# Patient Record
Sex: Male | Born: 1935 | Race: White | Hispanic: No | Marital: Married | Smoking: Never smoker
Health system: Southern US, Community
[De-identification: ages and names within clinical notes are randomized; demographics above are authoritative.]

## PROBLEM LIST (undated history)

## (undated) DIAGNOSIS — E119 Type 2 diabetes mellitus without complications: Secondary | ICD-10-CM

## (undated) DIAGNOSIS — N400 Enlarged prostate without lower urinary tract symptoms: Secondary | ICD-10-CM

## (undated) DIAGNOSIS — R350 Frequency of micturition: Secondary | ICD-10-CM

## (undated) DIAGNOSIS — G20A1 Parkinson's disease without dyskinesia, without mention of fluctuations: Secondary | ICD-10-CM

## (undated) DIAGNOSIS — G20 Parkinson's disease: Secondary | ICD-10-CM

---
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
## 1996-11-25 DIAGNOSIS — I10 Essential (primary) hypertension: Secondary | ICD-10-CM | POA: Insufficient documentation

## 2011-02-26 DIAGNOSIS — R269 Unspecified abnormalities of gait and mobility: Secondary | ICD-10-CM | POA: Insufficient documentation

## 2011-04-08 DIAGNOSIS — E1142 Type 2 diabetes mellitus with diabetic polyneuropathy: Secondary | ICD-10-CM | POA: Insufficient documentation

## 2012-11-12 DIAGNOSIS — R350 Frequency of micturition: Secondary | ICD-10-CM | POA: Insufficient documentation

## 2012-11-12 DIAGNOSIS — E160 Drug-induced hypoglycemia without coma: Secondary | ICD-10-CM | POA: Insufficient documentation

## 2012-11-12 DIAGNOSIS — D649 Anemia, unspecified: Secondary | ICD-10-CM | POA: Insufficient documentation

## 2013-02-03 DIAGNOSIS — R259 Unspecified abnormal involuntary movements: Secondary | ICD-10-CM | POA: Insufficient documentation

## 2013-02-06 DIAGNOSIS — R2689 Other abnormalities of gait and mobility: Secondary | ICD-10-CM | POA: Insufficient documentation

## 2014-02-07 ENCOUNTER — Encounter: Payer: Self-pay | Admitting: Podiatry

## 2014-02-07 ENCOUNTER — Ambulatory Visit (INDEPENDENT_AMBULATORY_CARE_PROVIDER_SITE_OTHER): Payer: Medicare Other

## 2014-02-07 ENCOUNTER — Ambulatory Visit (INDEPENDENT_AMBULATORY_CARE_PROVIDER_SITE_OTHER): Payer: Medicare Other | Admitting: Podiatry

## 2014-02-07 VITALS — BP 136/62 | HR 58 | Resp 16 | Ht 69.0 in | Wt 144.0 lb

## 2014-02-07 DIAGNOSIS — M79609 Pain in unspecified limb: Secondary | ICD-10-CM

## 2014-02-07 DIAGNOSIS — B351 Tinea unguium: Secondary | ICD-10-CM

## 2014-02-07 DIAGNOSIS — L608 Other nail disorders: Secondary | ICD-10-CM

## 2014-02-07 DIAGNOSIS — E109 Type 1 diabetes mellitus without complications: Secondary | ICD-10-CM

## 2014-02-07 NOTE — Progress Notes (Signed)
   Subjective:    Patient ID: Chase Flores, male    DOB: Apr 22, 1924, 80 y.o.   MRN: [id number]  HPI Comments: i have a fungus problem. The nails do not hurt. Ive had it for several years. i was treated for it at [facility] years ago. i have my nails trimmed by my doctor at [facility].      Review of Systems  HENT: Positive for hearing loss.   Endocrine:       Increase urination  Genitourinary: Positive for urgency and frequency.  Musculoskeletal:       Difficulty walking  Skin:       Change in nails  All other systems reviewed and are negative.      Objective:   Physical Exam: I have reviewed his past medical history medications allergies surgeries social history and review of systems. Pulses are strongly palpable bilateral. Neurologic sensorium appears to be intact deep tendon reflexes are intact bilateral muscle strength is 5 over 5 dorsiflexors plantar flexors inverters everters all of his musculature is intact. Orthopedic evaluation demonstrates all joints distal to the ankle have a full range of motion without crepitation. Cutaneous evaluation demonstrates supple well hydrated cutis thick yellow dystrophic clinically mycotic nail hallux left. Second digit of the right foot as well. However the others remain thick and non-discolored. History of diabetes is noted here.        Assessment & Plan:  Assessment: Non-insulin-dependent diabetes mellitus. Pain in limb secondary to onychomycosis 1 through 5 bilateral.  Plan: Debridement of nails bilateral covered

## 2014-03-01 DIAGNOSIS — I10 Essential (primary) hypertension: Secondary | ICD-10-CM | POA: Insufficient documentation

## 2014-03-01 DIAGNOSIS — N400 Enlarged prostate without lower urinary tract symptoms: Secondary | ICD-10-CM | POA: Insufficient documentation

## 2014-04-14 ENCOUNTER — Observation Stay: Payer: Self-pay | Admitting: Internal Medicine

## 2014-04-14 LAB — COMPREHENSIVE METABOLIC PANEL
Albumin: 2.9 g/dL — ABNORMAL LOW (ref 3.4–5.0)
Alkaline Phosphatase: 91 U/L
Anion Gap: 6 — ABNORMAL LOW (ref 7–16)
BILIRUBIN TOTAL: 0.6 mg/dL (ref 0.2–1.0)
BUN: 30 mg/dL — ABNORMAL HIGH (ref 7–18)
CREATININE: 0.89 mg/dL (ref 0.60–1.30)
Calcium, Total: 8 mg/dL — ABNORMAL LOW (ref 8.5–10.1)
Chloride: 105 mmol/L (ref 98–107)
Co2: 28 mmol/L (ref 21–32)
EGFR (African American): >60
EGFR (Non-African Amer.): >60
Glucose: 137 mg/dL — ABNORMAL HIGH (ref 65–99)
OSMOLALITY: 286 (ref 275–301)
Potassium: 4.3 mmol/L (ref 3.5–5.1)
SGOT(AST): 27 U/L (ref 15–37)
SGPT (ALT): 29 U/L
Sodium: 139 mmol/L (ref 136–145)
Total Protein: 6.5 g/dL (ref 6.4–8.2)

## 2014-04-14 LAB — URINALYSIS, COMPLETE
BILIRUBIN, UR: NEGATIVE
Bacteria: NONE SEEN
Blood: NEGATIVE
GLUCOSE, UR: NEGATIVE mg/dL (ref 0–75)
KETONE: NEGATIVE
Leukocyte Esterase: NEGATIVE
Nitrite: NEGATIVE
PROTEIN: NEGATIVE
Ph: 7 (ref 4.5–8.0)
RBC, UR: NONE SEEN /HPF (ref 0–5)
SPECIFIC GRAVITY: 1.009 (ref 1.003–1.030)
WBC UR: NONE SEEN /HPF (ref 0–5)

## 2014-04-14 LAB — CBC WITH DIFFERENTIAL/PLATELET
BASOS ABS: 0.1 10*3/uL (ref 0.0–0.1)
Basophil %: 1 %
EOS ABS: 0.2 10*3/uL (ref 0.0–0.7)
Eosinophil %: 2.3 %
HCT: 37.2 % — ABNORMAL LOW (ref 40.0–52.0)
HGB: 12.1 g/dL — AB (ref 13.0–18.0)
LYMPHS PCT: 17.7 %
Lymphocyte #: 1.4 10*3/uL (ref 1.0–3.6)
MCH: 31.9 pg (ref 26.0–34.0)
MCHC: 32.7 g/dL (ref 32.0–36.0)
MCV: 98 fL (ref 80–100)
MONO ABS: 0.8 x10 3/mm (ref 0.2–1.0)
Monocyte %: 9.7 %
Neutrophil #: 5.7 10*3/uL (ref 1.4–6.5)
Neutrophil %: 69.3 %
Platelet: 231 10*3/uL (ref 150–440)
RBC: 3.8 10*6/uL — ABNORMAL LOW (ref 4.40–5.90)
RDW: 13.4 % (ref 11.5–14.5)
WBC: 8.2 10*3/uL (ref 3.8–10.6)

## 2014-04-14 LAB — TROPONIN I: Troponin-I: 0.02 ng/mL

## 2014-04-14 LAB — PROTIME-INR
INR: 0.9
PROTHROMBIN TIME: 12.2 s (ref 11.5–14.7)

## 2014-04-14 LAB — APTT: Activated PTT: 27.9 secs (ref 23.6–35.9)

## 2014-04-15 LAB — CBC WITH DIFFERENTIAL/PLATELET
BASOS ABS: 0.1 10*3/uL (ref 0.0–0.1)
Basophil %: 1.1 %
Eosinophil #: 0.2 10*3/uL (ref 0.0–0.7)
Eosinophil %: 2.6 %
HCT: 36.5 % — AB (ref 40.0–52.0)
HGB: 12.1 g/dL — ABNORMAL LOW (ref 13.0–18.0)
Lymphocyte #: 1.5 10*3/uL (ref 1.0–3.6)
Lymphocyte %: 18.6 %
MCH: 32.2 pg (ref 26.0–34.0)
MCHC: 33 g/dL (ref 32.0–36.0)
MCV: 98 fL (ref 80–100)
MONO ABS: 0.7 x10 3/mm (ref 0.2–1.0)
Monocyte %: 8.1 %
NEUTROS ABS: 5.6 10*3/uL (ref 1.4–6.5)
NEUTROS PCT: 69.6 %
PLATELETS: 220 10*3/uL (ref 150–440)
RBC: 3.75 10*6/uL — ABNORMAL LOW (ref 4.40–5.90)
RDW: 13.4 % (ref 11.5–14.5)
WBC: 8.1 10*3/uL (ref 3.8–10.6)

## 2014-04-15 LAB — BASIC METABOLIC PANEL
ANION GAP: 7 (ref 7–16)
BUN: 24 mg/dL — AB (ref 7–18)
Calcium, Total: 8 mg/dL — ABNORMAL LOW (ref 8.5–10.1)
Chloride: 108 mmol/L — ABNORMAL HIGH (ref 98–107)
Co2: 28 mmol/L (ref 21–32)
Creatinine: 0.81 mg/dL (ref 0.60–1.30)
EGFR (African American): >60
EGFR (Non-African Amer.): >60
Glucose: 86 mg/dL (ref 65–99)
OSMOLALITY: 288 (ref 275–301)
Potassium: 3.8 mmol/L (ref 3.5–5.1)
Sodium: 143 mmol/L (ref 136–145)

## 2014-04-15 LAB — LIPID PANEL
CHOLESTEROL: 108 mg/dL (ref 0–200)
HDL Cholesterol: 49 mg/dL (ref 40–60)
Ldl Cholesterol, Calc: 42 mg/dL (ref 0–100)
TRIGLYCERIDES: 87 mg/dL (ref 0–200)
VLDL CHOLESTEROL, CALC: 17 mg/dL (ref 5–40)

## 2014-04-15 LAB — HEMOGLOBIN A1C: HEMOGLOBIN A1C: 8.1 % — AB (ref 4.2–6.3)

## 2014-04-25 DIAGNOSIS — N3281 Overactive bladder: Secondary | ICD-10-CM | POA: Insufficient documentation

## 2014-05-11 ENCOUNTER — Ambulatory Visit: Payer: Medicare Other | Admitting: Podiatry

## 2014-05-16 DIAGNOSIS — E7800 Pure hypercholesterolemia, unspecified: Secondary | ICD-10-CM | POA: Insufficient documentation

## 2014-05-30 ENCOUNTER — Ambulatory Visit: Payer: Medicare Other | Admitting: Podiatry

## 2014-09-17 NOTE — Discharge Summary (Signed)
Dates of Admission and Diagnosis:  Date of Admission 14-Apr-2014   Date of Discharge 15-Apr-2014   Admitting Diagnosis transient ischemic attack   Final Diagnosis Transient aphasia- may be transient ischemic attack. Hypertension Diabetes Benign prostatic hyperplasia.    Chief Complaint/History of Present Illness 81 year old man presents today with difficulty speaking earlier this morning which is now resolved. He reports that he fell on Monday and hit his back and bottom and has had pain in these areas since that time. This afternoon when he awoke from a nap he realized that he could not stand when getting up from the bed, he thought this was due to the pain of the fall.  He crawled to his shoes to put them on. His wife came to ask what he was doing and he tried to speak to her, but his speech was very garbled. This lasted for 1-2 minutes. His wife called the nurse at [facility] living facility who came to assess the patient, by that time his speech was clear and his examination was normal. The nurse left and about 30 minutes later he had another episode of garbled speech which lasted again 1-2 minutes. Afterwards he was slightly confused, but speaking clearly and his wife called EMS.   Allergies:  No Known Allergies:   Pertinent Past History:  Pertinent Past History 1.  Diabetes mellitus type 1, diagnosed in his late 30s.  2.  Benign prosthetic hypertrophy.  3.  Parkinsonian symptoms without diagnosis of Parkinson disease.   Hospital Course:  Hospital Course 1. Transient expressive dysphasia:  CT shows no acute change, negative MRI, <70% block on Carotid doppler are non contributory to this.   may be this was secondary to TIA episode.   Cont ASA and Statin.   Eval by PT- suggest to have home health- pt and his wife both feels they will be fine without that, they are very independent .    2.  Diabetes mellitus type 1: Continue with Levemir and sliding scale. Carbohydrate modified  diet.  3.  Benign prostatic hypertrophy: We will continue with home medication tamsulosin, finasteride, Myrbetriq.  encouraged to use his bed pan at night time. 4.  Compression fracture: T12 compression fracture of uncertain chronicity. This does not correlate with his current pain in the lumbar area. This is likely an old fracture and does not require any further evaluation or treatment at this time.  5.  Prophylaxis: Heparin for DVT prophylaxis.  6. Htn- lisinopril and Amlodipin. Slightly high- may be due to being in hospital.   Condition on Discharge Stable   DISCHARGE INSTRUCTIONS HOME MEDS:  Medication Reconciliation: Patient's Home Medications at Discharge:     Medication Instructions  acarbose 50 mg oral tablet  0.5 tab(s) orally once a day (at bedtime)   amlodipine 2.5 mg oral tablet  1 tab(s) orally once a day   atorvastatin 80 mg oral tablet  0.5 tab(s) orally once a day   finasteride 5 mg oral tablet  1 tab(s) orally once a day at noon   lisinopril 40 mg oral tablet  1 tab(s) orally once a day   myrbetriq 50 mg oral tablet, extended release  1 tab(s) orally once a day   tamsulosin 0.4 mg oral capsule  2 cap(s) orally once a day (at bedtime)   humalog 100 units/ml subcutaneous solution  1 dose(s) subcutaneous 3 times a day per sliding scale   lantus 100 units/ml subcutaneous solution  10 unit(s) subcutaneous 2 times a day  aspirin 81 mg oral tablet  1 tab(s) orally once a day after lunch   folic acid 0.4 mg oral tablet  1 tab(s) orally once a day after lunch   vitamin c 500 mg oral tablet  1 tab(s) orally once a day after lunch   vitamin d3 2000 intl units oral tablet  1 tab(s) orally once a day after lunch     Physician's Instructions:  Diet Low Sodium  Low Fat, Low Cholesterol   Activity Limitations As tolerated   Return to Work Not Applicable   Time frame for Follow Up Appointment 1-2 weeks  PMD   Other Comments routine follow ups with PMD>     Arnoldo HookerKowalski, Bruce  J(Consultant): [facility], [address], [place], [place] [address], [place] [phone]  TIME SPENT:  Total Time: Greater than 30 minutes   Electronic Signatures: Altamese DillingVachhani, Cristino Degroff (MD)  (Signed [id number]-Nov-15 11:04)  Authored: ADMISSION DATE AND DIAGNOSIS, CHIEF COMPLAINT/HPI, Allergies, PERTINENT PAST HISTORY, HOSPITAL COURSE, DISCHARGE INSTRUCTIONS HOME MEDS, PATIENT INSTRUCTIONS, Follow Up Physician, TIME SPENT   Last Updated: 24-Nov-15 11:04 by Altamese DillingVachhani, Lorraina Spring (MD)

## 2014-09-17 NOTE — H&P (Signed)
PATIENT NAME:  Chase Flores, Chase Flores MR#:  [id number] DATE OF BIRTH:  Aug 10, 1923  DATE OF ADMISSION:  04/14/2014  REFERRING EMERGENCY ROOM PHYSICIAN:  Dr. Shaune Pollack.   PRIMARY CARE PHYSICIAN:  Dr. Larwance Sachs with [facility].    PRIMARY ENDOCRINOLOGIST, Dr. Natasha Mead at [facility].    PRIMARY UROLOGIST:  Dr. Clotilde Dieter at [facility].   CHIEF COMPLAINT: Expressive aphasia.   HISTORY OF PRESENT ILLNESS: This very pleasant 81 year old man presents today with difficulty speaking earlier this morning which is now resolved. He reports that he fell on Monday and hit his back and bottom and has had pain in these areas since that time. This afternoon when he awoke from a nap he realized that he could not stand when getting up from the bed, he thought this was due to the pain of the fall.  He crawled to his shoes to put them on. His wife came to ask what he was doing and he tried to speak to her, but his speech was very garbled. This lasted for 1-2 minutes. His wife called the nurse at [facility] living facility who came to assess the patient, by that time his speech was clear and his examination was normal. The nurse left and about 30 minutes later he had another episode of garbled speech which lasted again 1-2 minutes. Afterwards he was slightly confused, but speaking clearly and his wife called EMS.   PAST MEDICAL HISTORY:  1.  Diabetes mellitus type 1, diagnosed in his late 30s.  2.  Benign prosthetic hypertrophy.  3.  Parkinsonian symptoms without diagnosis of Parkinson disease.   PAST SURGICAL HISTORY:  Cataract surgery.   SOCIAL HISTORY: The patient lives independently in the [facility] living facility with his wife. He uses a cane occasionally for ambulation. Since Monday when he fell he has been using a borrowed walker. He does not smoke cigarettes or drink alcohol.   FAMILY MEDICAL HISTORY: Positive for coronary artery disease in his father. He has 2 brothers who also have type 1 diabetes diagnosed in their 30s. No family history  of stroke or cancers that he knows of.   ALLERGIES: No known allergies.   MEDICATIONS:   1. Vitamin D3, 2000 international units 1 tablet daily.  2. Vitamin C 1 tablet once a day 500 mg.  3. Tamsulosin 0.4 mg 2 capsules once a day at bedtime.  4. Myrbetriq 50 mg 1 tablet once a day.  5. Lisinopril 40 mg 1 tablet once a day.  6. Lantus 100 units/mL subcutaneous solution 10 units subcutaneously twice a day.  7. Humalog 100 units/mL 1 dose subcutaneously 3 times a day per sliding scale.  8. Folic acid 0.4 mg oral tablet 1 tablet once a day after lunch.  9. Finasteride 5 mg 1 tablet once a day at noon.  10. Atorvastatin 80 mg 0.5 tablets once a day.  11. Aspirin 81 mg 1 tablet once a day.  12. Amlodipine 2.5 mg 1 tablet once a day.  13. Acarbose 50 mg 0.5 tablets once a day.   REVIEW OF SYSTEMS:  CONSTITUTIONAL: Negative for fever, fatigue, pain, weight loss or weight gain.  HEENT: Negative for change in hearing or vision, no pain in the eyes or ears, no difficulty swallowing or oral pain.  RESPIRATORY: No shortness of breath, cough, wheezing, pleuritic type chest pain.  CARDIOVASCULAR: No palpitations, chest pain, edema, orthopnea.  GASTROINTESTINAL:  No nausea, vomiting, diarrhea, abdominal pain, hematochezia, or melena.  GENITOURINARY: Positive for urinary frequency,  negative for dysuria.  MUSCULOSKELETAL: Positive for pain in the low back after a fall, negative for swollen tender joints, positive for chronic Parkinsonian-type symptoms with a shuffling gait and some rigidity.  NEUROLOGIC: No focal numbness or weakness. Positive for expressive aphasia earlier this morning, no headache, no seizure, has had some confusion today, otherwise at baseline is usually very clear.  PSYCHIATRY: No uncontrolled depression or anxiety.   PHYSICAL EXAMINATION:  VITAL SIGNS: Temperature 98.5, pulse 71, respirations 16, blood pressure 198/88, oxygenation 98% on room air.  GENERAL: No acute distress,  thin.  HEENT: Pupils equal, round, and reactive to light, conjunctivae are clear, extraocular motion is intact, oral mucous membranes are pink and moist, good dentition, posterior oropharynx is clear, no cervical lymphadenopathy, trachea midline, thyroid is nontender.  PULMONARY: Lungs are clear to auscultation bilaterally with good air movement.  CARDIOVASCULAR: Regular rate and rhythm, there is a 5/6 diastolic murmur heard best at the far left chest but heard easily throughout the precordium, no carotid bruit, no peripheral edema, peripheral pulses are 1 +.  ABDOMEN: Soft, nontender, nondistended, no guarding, no rebound, bowel sounds are normal.  MUSCULOSKELETAL: No joint effusions, range of motion is normal, strength is 5 out of 5 throughout.  NEUROLOGIC: Cranial nerves II through XII grossly intact, strength and sensation intact, nonfocal neurologic examination, the patient is speaking clearly at the time of interview.  PSYCHIATRIC: The patient is alert and oriented, he does not show any signs of uncontrolled depression or anxiety.   LABORATORY DATA: Serum sodium 139, potassium 4.3, chloride 105, bicarbonate 28, BUN 30, creatinine 0.89, glucose 137. LFTs show low albumin at 2.9. Troponin is 0.02. White blood cells 8.2, hemoglobin 12.1, MCV 98, platelets 231,000. UA is negative for signs of infection.   IMAGING:  1.  CT of the head shows no evidence of acute intracranial abnormality. Moderate cerebral atrophy and mild chronic small vessel ischemic disease with possible chronic lacunar infarcts.  2.  X-ray of the lumbar spine shows T12 compression deformity of uncertain chronicity.  3.  Abdomen 3 way shows nonspecific chest and abdomen. T12 compression deformity.   ASSESSMENT AND PLAN:  1.  Transient expressive dysphasia: CT shows no acute change, though it does note some old lacunar infarcts. Will obtain an MRI. Will continue with aspirin and statin therapy. We will admit the patient and monitor  on telemetry. We will get a physical therapy consultation as well as a speech therapy consultation. He has a loud murmur, wife reports that he has never been told about this murmur in the past. We will get a 2-D echocardiogram for further evaluation. We will also order carotid Dopplers as there is evidence of old stroke.  2.  Diabetes mellitus type 1: Continue with Levemir and sliding scale. We will check a hemoglobin A1c.  Wife reports that blood sugars have been elevated recently. Carbohydrate modified diet.  3.  Benign prostatic hypertrophy: We will continue with home medication tamsulosin, finasteride, Myrbetriq.   4.  Compression fracture: T12 compression fracture of uncertain chronicity. This does not correlate with his current pain in the lumbar area. This is likely an old fracture and does not require any further evaluation or treatment at this time.  5.  Prophylaxis: Heparin for DVT prophylaxis. We will not initiate PPI as he is not critically ill at this time.  6.  Hypertension: The patient currently has a very elevated blood pressure report. We will continue with home blood pressure medications amlodipine and lisinopril with  hydralazine p.r.n. for systolic blood pressures over 161. We will allow for permissive hypertension in the setting of possible acute stroke.   TIME SPENT ON ADMISSION: 45 minutes.    ____________________________ Ena Dawley. Clent Ridges, MD cpw:bu D: 04/14/2014 19:13:31 ET T: 04/14/2014 19:48:16 ET JOB#: [id number]  cc: Santina Evans P. Clent Ridges, MD, <Dictator> Gale Journey MD ELECTRONICALLY SIGNED 04/14/2014 22:47

## 2014-12-13 DIAGNOSIS — E109 Type 1 diabetes mellitus without complications: Secondary | ICD-10-CM | POA: Insufficient documentation

## 2015-06-07 ENCOUNTER — Ambulatory Visit (INDEPENDENT_AMBULATORY_CARE_PROVIDER_SITE_OTHER): Payer: Medicare Other | Admitting: Podiatry

## 2015-06-07 ENCOUNTER — Encounter: Payer: Self-pay | Admitting: Podiatry

## 2015-06-07 DIAGNOSIS — B351 Tinea unguium: Secondary | ICD-10-CM | POA: Diagnosis not present

## 2015-06-07 DIAGNOSIS — S90129A Contusion of unspecified lesser toe(s) without damage to nail, initial encounter: Secondary | ICD-10-CM

## 2015-06-07 DIAGNOSIS — M79676 Pain in unspecified toe(s): Secondary | ICD-10-CM | POA: Diagnosis not present

## 2015-06-07 NOTE — Progress Notes (Signed)
He presents today chief complaint of painful elongated toenails bilaterally. Concerned about a dark area on the second digit left foot.  Objective: Pulses are palpable bilateral. His nails are thick yellow dystrophic with mycotic subungual hematoma second nail plate left.  Assessment: Pain in limb cigarette onychomycosis and subungual hematoma second toe left.  Plan: Debridement of nails 1 through 5 bilateral cover service secondary to pain.

## 2015-06-21 ENCOUNTER — Ambulatory Visit: Payer: Medicare Other | Admitting: Podiatry

## 2015-08-30 ENCOUNTER — Encounter: Payer: Self-pay | Admitting: Podiatry

## 2015-08-30 ENCOUNTER — Ambulatory Visit (INDEPENDENT_AMBULATORY_CARE_PROVIDER_SITE_OTHER): Payer: Medicare Other | Admitting: Podiatry

## 2015-08-30 DIAGNOSIS — M79676 Pain in unspecified toe(s): Secondary | ICD-10-CM

## 2015-08-30 DIAGNOSIS — B351 Tinea unguium: Secondary | ICD-10-CM | POA: Diagnosis not present

## 2015-08-30 NOTE — Progress Notes (Signed)
He presents today with a chief complaint of diabetes and painful elongated toenails.  Objective: Vital signs stable alert and oriented 3. Pulses are palpable. Capillary fill time is perfect bilateral. Toenails are thick yellow dystrophic onychomycotic painful. Subungual hematomas noted second digit left foot.  Assessment: Pain in limb secondary to onychomycosis.  Plan: Debridement of toenails 1 through 5 bilateral.

## 2015-11-17 ENCOUNTER — Emergency Department: Payer: Medicare Other

## 2015-11-17 ENCOUNTER — Other Ambulatory Visit: Payer: Self-pay

## 2015-11-17 ENCOUNTER — Inpatient Hospital Stay
Admission: EM | Admit: 2015-11-17 | Discharge: 2015-11-19 | DRG: 638 | Disposition: A | Discharge status: Home / Self Care | Payer: Medicare Other | Attending: Internal Medicine | Admitting: Internal Medicine

## 2015-11-17 DIAGNOSIS — N400 Enlarged prostate without lower urinary tract symptoms: Secondary | ICD-10-CM | POA: Diagnosis present

## 2015-11-17 DIAGNOSIS — R29818 Other symptoms and signs involving the nervous system: Secondary | ICD-10-CM | POA: Diagnosis present

## 2015-11-17 DIAGNOSIS — Z794 Long term (current) use of insulin: Secondary | ICD-10-CM

## 2015-11-17 DIAGNOSIS — Z79899 Other long term (current) drug therapy: Secondary | ICD-10-CM

## 2015-11-17 DIAGNOSIS — E7800 Pure hypercholesterolemia, unspecified: Secondary | ICD-10-CM | POA: Diagnosis present

## 2015-11-17 DIAGNOSIS — E10649 Type 1 diabetes mellitus with hypoglycemia without coma: Principal | ICD-10-CM | POA: Diagnosis present

## 2015-11-17 DIAGNOSIS — G20 Parkinson's disease: Secondary | ICD-10-CM | POA: Diagnosis present

## 2015-11-17 DIAGNOSIS — Z7951 Long term (current) use of inhaled steroids: Secondary | ICD-10-CM

## 2015-11-17 DIAGNOSIS — I639 Cerebral infarction, unspecified: Secondary | ICD-10-CM | POA: Diagnosis present

## 2015-11-17 DIAGNOSIS — Z820 Family history of epilepsy and other diseases of the nervous system: Secondary | ICD-10-CM

## 2015-11-17 DIAGNOSIS — Z7982 Long term (current) use of aspirin: Secondary | ICD-10-CM

## 2015-11-17 DIAGNOSIS — R001 Bradycardia, unspecified: Secondary | ICD-10-CM | POA: Diagnosis present

## 2015-11-17 DIAGNOSIS — Z833 Family history of diabetes mellitus: Secondary | ICD-10-CM

## 2015-11-17 DIAGNOSIS — R259 Unspecified abnormal involuntary movements: Secondary | ICD-10-CM | POA: Diagnosis present

## 2015-11-17 DIAGNOSIS — I6529 Occlusion and stenosis of unspecified carotid artery: Secondary | ICD-10-CM | POA: Diagnosis present

## 2015-11-17 DIAGNOSIS — E109 Type 1 diabetes mellitus without complications: Secondary | ICD-10-CM | POA: Diagnosis present

## 2015-11-17 DIAGNOSIS — R4781 Slurred speech: Secondary | ICD-10-CM | POA: Diagnosis present

## 2015-11-17 DIAGNOSIS — R4701 Aphasia: Secondary | ICD-10-CM | POA: Diagnosis not present

## 2015-11-17 DIAGNOSIS — I472 Ventricular tachycardia: Secondary | ICD-10-CM | POA: Diagnosis present

## 2015-11-17 DIAGNOSIS — I10 Essential (primary) hypertension: Secondary | ICD-10-CM | POA: Diagnosis present

## 2015-11-17 HISTORY — DX: Benign prostatic hyperplasia without lower urinary tract symptoms: N40.0

## 2015-11-17 HISTORY — DX: Type 2 diabetes mellitus without complications: E11.9

## 2015-11-17 HISTORY — DX: Parkinson's disease without dyskinesia, without mention of fluctuations: G20.A1

## 2015-11-17 HISTORY — DX: Parkinson's disease: G20

## 2015-11-17 HISTORY — DX: Frequency of micturition: R35.0

## 2015-11-17 LAB — CBC
HCT: 38.1 % — ABNORMAL LOW (ref 40.0–52.0)
Hemoglobin: 12.9 g/dL — ABNORMAL LOW (ref 13.0–18.0)
MCH: 33.3 pg (ref 26.0–34.0)
MCHC: 33.7 g/dL (ref 32.0–36.0)
MCV: 98.8 fL (ref 80.0–100.0)
PLATELETS: 179 10*3/uL (ref 150–440)
RBC: 3.86 MIL/uL — AB (ref 4.40–5.90)
RDW: 13.4 % (ref 11.5–14.5)
WBC: 7.1 10*3/uL (ref 3.8–10.6)

## 2015-11-17 LAB — COMPREHENSIVE METABOLIC PANEL
ALBUMIN: 3.8 g/dL (ref 3.5–5.0)
ALT: 19 U/L (ref 17–63)
ANION GAP: 8 (ref 5–15)
AST: 25 U/L (ref 15–41)
Alkaline Phosphatase: 79 U/L (ref 38–126)
BUN: 39 mg/dL — ABNORMAL HIGH (ref 6–20)
CALCIUM: 8.8 mg/dL — AB (ref 8.9–10.3)
CO2: 28 mmol/L (ref 22–32)
Chloride: 102 mmol/L (ref 101–111)
Creatinine, Ser: 0.94 mg/dL (ref 0.61–1.24)
GFR calc non Af Amer: >60 mL/min (ref >60)
GLUCOSE: 273 mg/dL — AB (ref 65–99)
POTASSIUM: 4.8 mmol/L (ref 3.5–5.1)
SODIUM: 138 mmol/L (ref 135–145)
Total Bilirubin: 1 mg/dL (ref 0.3–1.2)
Total Protein: 6.9 g/dL (ref 6.5–8.1)

## 2015-11-17 LAB — GLUCOSE, CAPILLARY
GLUCOSE-CAPILLARY: 256 mg/dL — AB (ref 65–99)
Glucose-Capillary: 262 mg/dL — ABNORMAL HIGH (ref 65–99)

## 2015-11-17 LAB — URINALYSIS COMPLETE WITH MICROSCOPIC (ARMC ONLY)
Bacteria, UA: NONE SEEN
Bilirubin Urine: NEGATIVE
HGB URINE DIPSTICK: NEGATIVE
KETONES UR: NEGATIVE mg/dL
Leukocytes, UA: NEGATIVE
NITRITE: NEGATIVE
PROTEIN: NEGATIVE mg/dL
SPECIFIC GRAVITY, URINE: 1.01 (ref 1.005–1.030)
Squamous Epithelial / LPF: NONE SEEN
pH: 9 — ABNORMAL HIGH (ref 5.0–8.0)

## 2015-11-17 LAB — PROTIME-INR
INR: 0.98
PROTHROMBIN TIME: 13.2 s (ref 11.4–15.0)

## 2015-11-17 LAB — TROPONIN I: Troponin I: <0.03 ng/mL (ref <0.031)

## 2015-11-17 LAB — DIFFERENTIAL
BASOS PCT: 1 %
Basophils Absolute: 0.1 10*3/uL (ref 0–0.1)
EOS ABS: 0.1 10*3/uL (ref 0–0.7)
EOS PCT: 2 %
Lymphocytes Relative: 24 %
Lymphs Abs: 1.7 10*3/uL (ref 1.0–3.6)
MONO ABS: 0.6 10*3/uL (ref 0.2–1.0)
Monocytes Relative: 8 %
NEUTROS PCT: 65 %
Neutro Abs: 4.6 10*3/uL (ref 1.4–6.5)

## 2015-11-17 LAB — APTT: aPTT: 31 seconds (ref 24–36)

## 2015-11-17 MED ORDER — ONDANSETRON HCL 4 MG/2ML IJ SOLN
INTRAMUSCULAR | Status: AC
  Administered 2015-11-17: 4 mg via INTRAVENOUS
  Filled 2015-11-17: qty 2

## 2015-11-17 MED ORDER — ASPIRIN 81 MG PO CHEW
324.0000 mg | CHEWABLE_TABLET | Freq: Once | ORAL | Status: AC
  Administered 2015-11-17: 324 mg via ORAL
  Filled 2015-11-17: qty 4

## 2015-11-17 MED ORDER — HYDRALAZINE HCL 20 MG/ML IJ SOLN
10.0000 mg | Freq: Once | INTRAMUSCULAR | Status: AC
  Administered 2015-11-17: 10 mg via INTRAVENOUS
  Filled 2015-11-17: qty 1

## 2015-11-17 MED ORDER — ONDANSETRON HCL 4 MG/2ML IJ SOLN
4.0000 mg | Freq: Once | INTRAMUSCULAR | Status: AC
  Administered 2015-11-17: 4 mg via INTRAVENOUS

## 2015-11-17 MED ORDER — LABETALOL HCL 5 MG/ML IV SOLN
INTRAVENOUS | Status: AC
  Administered 2015-11-17: 10 mg via INTRAVENOUS
  Filled 2015-11-17: qty 4

## 2015-11-17 MED ORDER — IOPAMIDOL (ISOVUE-370) INJECTION 76%
75.0000 mL | Freq: Once | INTRAVENOUS | Status: AC | PRN
  Administered 2015-11-17: 75 mL via INTRAVENOUS

## 2015-11-17 MED ORDER — LABETALOL HCL 5 MG/ML IV SOLN
10.0000 mg | Freq: Once | INTRAVENOUS | Status: AC
  Administered 2015-11-17: 10 mg via INTRAVENOUS

## 2015-11-17 NOTE — ED Notes (Addendum)
Pt bib EMS w/ c/o confusion and difficulty speaking that started 30 minutes ago.  Pt worse upon arrival to ED per EMS.  Per EMS, pt able to stand and walk to truck.  Wife sts that s/s start approx 1800.  She sts that he was trying to communicate, when speech grabbled.  Pt alert, resp even and skin WNL.  Pt unable to give name/age.  Word salad

## 2015-11-17 NOTE — ED Provider Notes (Signed)
[facility] Emergency Department Provider Note   ____________________________________________  Time seen: Approximately 8:04 PM  I have reviewed the triage vital signs and the nursing notes.   HISTORY  Chief Complaint Code Stroke    HPI Chase Flores is a 82 y.o. male with history of BPH with urinary obstruction, type 1 diabetes, hyperlipidemia, hypertension who presents for evaluation of speech difficulty since 6:00 this evening, gradual onset, constant, moderate, no modifying factors. This was noted by his wife at 6 PM, prior to that he had been in his usual state of health. No numbness, weakness, no recent illness including no vomiting, diarrhea, fevers or chills, no chest pain or difficulty breathing.   Past Medical History  Diagnosis Date  . Diabetes mellitus without complication (HCC)     type 1  . Enlarged prostate   . Parkinson disease (HCC)   . Frequency of urination     Patient Active Problem List   Diagnosis Date Noted  . Type 1 diabetes mellitus (HCC) 12/13/2014  . Pure hypercholesterolemia 05/16/2014  . Detrusor muscle hypertonia 04/25/2014  . Benign prostatic hyperplasia with urinary obstruction 03/01/2014  . Benign essential HTN 03/01/2014  . Multifactorial gait disorder 02/06/2013  . Parkinsonian features 02/03/2013  . Absolute anemia 11/12/2012  . FOM (frequency of micturition) 11/12/2012  . Hypoglycemia 11/12/2012  . Diabetic polyneuropathy (HCC) 04/08/2011  . Abnormal gait 02/26/2011  . Essential (primary) hypertension 11/25/1996  . Hypercholesterolemia 11/25/1996  . Contracture of palmar fascia 05/27/1899  . Blepharoptosis 05/27/1899    History reviewed. No pertinent past surgical history.  Current Outpatient Rx  Name  Route  Sig  Dispense  Refill  . acarbose (PRECOSE) 50 MG tablet   Oral   Take 25-50 mg by mouth at bedtime.          Marland Kitchen. amLODipine (NORVASC) 2.5 MG tablet   Oral   Take 2.5 mg by mouth daily.         . Ascorbic Acid (VITAMIN C WITH ROSE HIPS) 500 MG tablet   Oral   Take 500 mg by mouth daily.         Marland Kitchen. aspirin EC 81 MG tablet   Oral   Take 81 mg by mouth daily.          Marland Kitchen. atorvastatin (LIPITOR) 80 MG tablet   Oral   Take 40-80 mg by mouth at bedtime.          . cholecalciferol (VITAMIN D) 1000 units tablet   Oral   Take 1,000 Units by mouth daily.         . finasteride (PROSCAR) 5 MG tablet   Oral   Take 5 mg by mouth daily.          . fluticasone (FLONASE) 50 MCG/ACT nasal spray   Each Nare   Place 2 sprays into both nostrils daily.         . folic acid (FOLVITE) 400 MCG tablet   Oral   Take 400 mcg by mouth daily.          . insulin glargine (LANTUS) 100 UNIT/ML injection   Subcutaneous   Inject 7 Units into the skin 2 (two) times daily.          . insulin lispro (HUMALOG) 100 UNIT/ML injection   Subcutaneous   Inject 7 Units into the skin 3 (three) times daily before meals. Pt is to increase units based on BS readings.         .Marland Kitchen  lisinopril (PRINIVIL,ZESTRIL) 10 MG tablet   Oral   Take 10 mg by mouth daily.          . mirabegron ER (MYRBETRIQ) 50 MG TB24 tablet   Oral   Take 50 mg by mouth daily.          . sodium fluoride (DENTA 5000 PLUS) 1.1 % CREA dental cream   dental   Place 1 application onto teeth at bedtime.         . tamsulosin (FLOMAX) 0.4 MG CAPS capsule   Oral   Take 0.8 mg by mouth daily after supper.            Allergies Review of patient's allergies indicates no known allergies.  No family history on file.  Social History Social History  Substance Use Topics  . Smoking status: Never Smoker   . Smokeless tobacco: None  . Alcohol Use: No    Review of Systems Constitutional: No fever/chills Eyes: No visual changes. ENT: No sore throat. Cardiovascular: Denies chest pain. Respiratory: Denies shortness of breath. Gastrointestinal: No abdominal pain.  No nausea, no vomiting.  No diarrhea.  No  constipation. Genitourinary: Negative for dysuria. Musculoskeletal: Negative for back pain. Skin: Negative for rash. Neurological: Negative for headaches, focal weakness or numbness.  10-point ROS otherwise negative.  ____________________________________________   PHYSICAL EXAM:  VITAL SIGNS: ED Triage Vitals  Enc Vitals Group     BP 11/17/15 2002 223/81 mmHg     Pulse Rate 11/17/15 2002 65     Resp 11/17/15 2002 20     Temp 11/17/15 2002 99.3 F (37.4 C)     Temp Source 11/17/15 2002 Oral     SpO2 11/17/15 2002 98 %     Weight 11/17/15 2002 145 lb (65.772 kg)     Height --      Head Cir --      Peak Flow --      Pain Score --      Pain Loc --      Pain Edu? --      Excl. in GC? --     Constitutional: Alert. Sitting up in bed. He'll follow commands. Has  fluent expressive aphasia. Well appearing and in no acute distress. Eyes: Conjunctivae are normal. PERRL. EOMI. Head: Atraumatic. Nose: No congestion/rhinnorhea. Mouth/Throat: Mucous membranes are moist.  Oropharynx non-erythematous. Neck: No stridor. Supple without meningismus. Cardiovascular: Normal rate, regular rhythm. Grossly normal heart sounds.  Good peripheral circulation. Respiratory: Normal respiratory effort.  No retractions. Lungs CTAB. Gastrointestinal: Soft and nontender. No distention. No CVA tenderness. Genitourinary: deferred Musculoskeletal: No lower extremity tenderness nor edema.  No joint effusions. Neurologic:  Fluent expressive aphasia. 5 out of 5 strength in bilateral upper and lower extremities, sensation intact to light touch throughout. Skin:  Skin is warm, dry and intact. No rash noted. Psychiatric: Mood and affect are normal. Speech and behavior are normal.  ____________________________________________   LABS (all labs ordered are listed, but only abnormal results are displayed)  Labs Reviewed  CBC - Abnormal; Notable for the following:    RBC 3.86 (*)    Hemoglobin 12.9 (*)    HCT  38.1 (*)    All other components within normal limits  COMPREHENSIVE METABOLIC PANEL - Abnormal; Notable for the following:    Glucose, Bld 273 (*)    BUN 39 (*)    Calcium 8.8 (*)    All other components within normal limits  GLUCOSE, CAPILLARY - Abnormal; Notable for the  following:    Glucose-Capillary 256 (*)    All other components within normal limits  URINALYSIS COMPLETEWITH MICROSCOPIC ([facility] ONLY) - Abnormal; Notable for the following:    Color, Urine STRAW (*)    APPearance CLEAR (*)    Glucose, UA >500 (*)    pH 9.0 (*)    All other components within normal limits  GLUCOSE, CAPILLARY - Abnormal; Notable for the following:    Glucose-Capillary 262 (*)    All other components within normal limits  PROTIME-INR  APTT  DIFFERENTIAL  TROPONIN I  CBG MONITORING, ED   ____________________________________________  EKG  ED ECG REPORT I, Gayla DossGayle, Rotha Cassels A, the attending physician, personally viewed and interpreted this ECG.   Date: 11/17/2015  EKG Time: 19:50  Rate: 87  Rhythm: normal sinus rhythm, PACs  Axis: normal  Intervals:none  ST&T Change: No acute ST elevation, mild ST depression in V5. Q-wave in V2.  ____________________________________________  RADIOLOGY  CT head IMPRESSION: 1. No CT evidence of acute cortical infarction. 2. No intracranial hemorrhage. 3. Marked generalized cortical atrophy. Findings conveyed toERYKA Ginnette Gates on 11/17/2015 at20:19.  CTA head IMPRESSION: No emergent large vessel occlusion.  Severe stenoses bilateral supraclinoid internal carotid arteries. Additional high-grade stenoses of the anterior and posterior circulation, compatible with atherosclerosis. ____________________________________________   PROCEDURES  Procedure(s) performed: None  Critical Care performed: No  ____________________________________________   INITIAL IMPRESSION / ASSESSMENT AND PLAN / ED COURSE  Pertinent labs & imaging results that were available  during my care of the patient were reviewed by me and considered in my medical decision making (see chart for details).  Chase Flores is a 82 y.o. male with history of BPH with urinary obstruction, type 1 diabetes, hyperlipidemia, hypertension who presents for evaluation of speech difficulty since 6:00 pm this evening. On exam, he has a fluent expressive aphasia. No motor deficits or sensory deficits. Code stroke initiated, plan for CT head, labs. Blood pressure on arrival was 223/81, will give hydralazine, reassess. NIH stroke scale 2.  ----------------------------------------- 9:24 PM on 11/17/2015 ----------------------------------------- CT head shows no acute intra-cranial process. Tele neurologist on-call has evaluated the patient, family has decided against TPA after discussion of risks and benefits. Blood pressure improving with hydralazine and labetalol at this time, will allow permissive hypertension. Dr. Ander SladeBarbas of neurology recommends CTA which we will obtain, if there is an acute large vessel occlusion which could benefit from an intervention, [facility], otherwise [facility].  ----------------------------------------- 10:49 PM on 11/17/2015 ----------------------------------------- CTA of the brain shows no emergent large vessel occlusion. Case discussed with the hospitalist for admission at this time. ____________________________________________   FINAL CLINICAL IMPRESSION(S) / ED DIAGNOSES  Final diagnoses:  Cerebral infarction due to unspecified mechanism  Expressive aphasia      NEW MEDICATIONS STARTED DURING THIS VISIT:  New Prescriptions   No medications on file     Note:  This document was prepared using Dragon voice recognition software and may include unintentional dictation errors.    Gayla DossEryka A Nanetta Wiegman, MD 11/17/15 2249

## 2015-11-17 NOTE — ED Notes (Signed)
Pt. returned from XR. 

## 2015-11-17 NOTE — ED Notes (Signed)
Pt had emesis episode, MD Inocencio HomesGayle informed

## 2015-11-17 NOTE — ED Notes (Signed)
MD Gayle at bedside. 

## 2015-11-17 NOTE — ED Notes (Signed)
Patient transported to CT 

## 2015-11-17 NOTE — ED Notes (Signed)
Telepsych MD consult begins

## 2015-11-18 ENCOUNTER — Inpatient Hospital Stay: Payer: Medicare Other

## 2015-11-18 ENCOUNTER — Inpatient Hospital Stay
Admit: 2015-11-18 | Discharge: 2015-11-18 | Disposition: A | Discharge status: Home / Self Care | Payer: Medicare Other | Attending: Family Medicine | Admitting: Family Medicine

## 2015-11-18 DIAGNOSIS — E10649 Type 1 diabetes mellitus with hypoglycemia without coma: Secondary | ICD-10-CM | POA: Diagnosis present

## 2015-11-18 DIAGNOSIS — R001 Bradycardia, unspecified: Secondary | ICD-10-CM | POA: Diagnosis present

## 2015-11-18 DIAGNOSIS — R4781 Slurred speech: Secondary | ICD-10-CM | POA: Diagnosis not present

## 2015-11-18 DIAGNOSIS — Z820 Family history of epilepsy and other diseases of the nervous system: Secondary | ICD-10-CM | POA: Diagnosis not present

## 2015-11-18 DIAGNOSIS — Z794 Long term (current) use of insulin: Secondary | ICD-10-CM | POA: Diagnosis not present

## 2015-11-18 DIAGNOSIS — I639 Cerebral infarction, unspecified: Secondary | ICD-10-CM

## 2015-11-18 DIAGNOSIS — I472 Ventricular tachycardia: Secondary | ICD-10-CM | POA: Diagnosis present

## 2015-11-18 DIAGNOSIS — Z79899 Other long term (current) drug therapy: Secondary | ICD-10-CM | POA: Diagnosis not present

## 2015-11-18 DIAGNOSIS — Z833 Family history of diabetes mellitus: Secondary | ICD-10-CM | POA: Diagnosis not present

## 2015-11-18 DIAGNOSIS — Z7982 Long term (current) use of aspirin: Secondary | ICD-10-CM | POA: Diagnosis not present

## 2015-11-18 DIAGNOSIS — R4701 Aphasia: Secondary | ICD-10-CM | POA: Diagnosis present

## 2015-11-18 DIAGNOSIS — E7800 Pure hypercholesterolemia, unspecified: Secondary | ICD-10-CM | POA: Diagnosis present

## 2015-11-18 DIAGNOSIS — I10 Essential (primary) hypertension: Secondary | ICD-10-CM | POA: Diagnosis present

## 2015-11-18 DIAGNOSIS — N400 Enlarged prostate without lower urinary tract symptoms: Secondary | ICD-10-CM | POA: Diagnosis present

## 2015-11-18 DIAGNOSIS — Z7951 Long term (current) use of inhaled steroids: Secondary | ICD-10-CM | POA: Diagnosis not present

## 2015-11-18 DIAGNOSIS — G20 Parkinson's disease: Secondary | ICD-10-CM | POA: Diagnosis present

## 2015-11-18 DIAGNOSIS — I6529 Occlusion and stenosis of unspecified carotid artery: Secondary | ICD-10-CM | POA: Diagnosis present

## 2015-11-18 LAB — ECHOCARDIOGRAM COMPLETE BUBBLE STUDY
CHL CUP MV DEC (S): 380
E decel time: 380 msec
E/e' ratio: 19.09
FS: 37 % (ref 28–44)
IVS/LV PW RATIO, ED: 0.66
LA diam index: 2.3 cm/m2
LA vol index: 59 mL/m2
LASIZE: 39 mm
LAVOL: 100 mL
LAVOLA4C: 77.1 mL
LEFT ATRIUM END SYS DIAM: 39 mm
LV E/e' medial: 19.09
LV PW d: 11.5 mm — AB (ref 0.6–1.1)
LVEEAVG: 19.09
MV Peak grad: 5 mmHg
MV VTI: 168 cm
MV pk A vel: 110 m/s
MVPKEVEL: 117 m/s
RV LATERAL S' VELOCITY: 13 cm/s
TAPSE: 22.2 mm
TDI e' medial: 6.13

## 2015-11-18 LAB — BASIC METABOLIC PANEL
Anion gap: 6 (ref 5–15)
BUN: 33 mg/dL — AB (ref 6–20)
CHLORIDE: 105 mmol/L (ref 101–111)
CO2: 25 mmol/L (ref 22–32)
Calcium: 8.1 mg/dL — ABNORMAL LOW (ref 8.9–10.3)
Creatinine, Ser: 0.89 mg/dL (ref 0.61–1.24)
GFR calc Af Amer: >60 mL/min (ref >60)
GFR calc non Af Amer: >60 mL/min (ref >60)
GLUCOSE: 402 mg/dL — AB (ref 65–99)
POTASSIUM: 4.3 mmol/L (ref 3.5–5.1)
Sodium: 136 mmol/L (ref 135–145)

## 2015-11-18 LAB — CBC
HEMATOCRIT: 33.9 % — AB (ref 40.0–52.0)
HEMOGLOBIN: 11.4 g/dL — AB (ref 13.0–18.0)
MCH: 33 pg (ref 26.0–34.0)
MCHC: 33.8 g/dL (ref 32.0–36.0)
MCV: 97.6 fL (ref 80.0–100.0)
Platelets: 149 10*3/uL — ABNORMAL LOW (ref 150–440)
RBC: 3.47 MIL/uL — AB (ref 4.40–5.90)
RDW: 13.4 % (ref 11.5–14.5)
WBC: 7.6 10*3/uL (ref 3.8–10.6)

## 2015-11-18 LAB — LIPID PANEL
CHOL/HDL RATIO: 2.2 ratio
CHOLESTEROL: 125 mg/dL (ref 0–200)
HDL: 57 mg/dL (ref >40)
LDL CALC: 61 mg/dL (ref 0–99)
Triglycerides: 37 mg/dL (ref <150)
VLDL: 7 mg/dL (ref 0–40)

## 2015-11-18 LAB — GLUCOSE, CAPILLARY
GLUCOSE-CAPILLARY: 370 mg/dL — AB (ref 65–99)
GLUCOSE-CAPILLARY: 271 mg/dL — AB (ref 65–99)
GLUCOSE-CAPILLARY: 147 mg/dL — AB (ref 65–99)
GLUCOSE-CAPILLARY: 125 mg/dL — AB (ref 65–99)
GLUCOSE-CAPILLARY: 195 mg/dL — AB (ref 65–99)
Glucose-Capillary: 392 mg/dL — ABNORMAL HIGH (ref 65–99)

## 2015-11-18 LAB — HEMOGLOBIN A1C: Hgb A1c MFr Bld: 7.7 % — ABNORMAL HIGH (ref 4.0–6.0)

## 2015-11-18 MED ORDER — ASPIRIN-DIPYRIDAMOLE ER 25-200 MG PO CP12: 1.0000 | ORAL_CAPSULE | Freq: Two times a day (BID) | ORAL | Status: DC

## 2015-11-18 MED ORDER — ATORVASTATIN CALCIUM 20 MG PO TABS
40.0000 mg | ORAL_TABLET | Freq: Every day | ORAL | Status: DC
  Administered 2015-11-18: 40 mg via ORAL
  Filled 2015-11-18: qty 2

## 2015-11-18 MED ORDER — MAGNESIUM SULFATE 2 GM/50ML IV SOLN
2.0000 g | Freq: Once | INTRAVENOUS | Status: AC
  Administered 2015-11-18: 2 g via INTRAVENOUS
  Filled 2015-11-18: qty 50

## 2015-11-18 MED ORDER — INSULIN GLARGINE 100 UNIT/ML ~~LOC~~ SOLN
7.0000 [IU] | Freq: Two times a day (BID) | SUBCUTANEOUS | Status: DC
  Administered 2015-11-18: 7 [IU] via SUBCUTANEOUS
  Filled 2015-11-18 (×3): qty 0.07

## 2015-11-18 MED ORDER — KCL IN DEXTROSE-NACL 20-5-0.45 MEQ/L-%-% IV SOLN
INTRAVENOUS | Status: DC
  Filled 2015-11-18: qty 1000

## 2015-11-18 MED ORDER — ONDANSETRON HCL 4 MG/2ML IJ SOLN: 4.0000 mg | Freq: Four times a day (QID) | INTRAMUSCULAR | Status: DC | PRN

## 2015-11-18 MED ORDER — METOPROLOL TARTRATE 5 MG/5ML IV SOLN: 5.0000 mg | INTRAVENOUS | Status: DC | PRN

## 2015-11-18 MED ORDER — INSULIN ASPART 100 UNIT/ML ~~LOC~~ SOLN
6.0000 [IU] | Freq: Once | SUBCUTANEOUS | Status: AC
  Administered 2015-11-18: 6 [IU] via SUBCUTANEOUS
  Filled 2015-11-18: qty 6

## 2015-11-18 MED ORDER — INSULIN ASPART 100 UNIT/ML ~~LOC~~ SOLN
0.0000 [IU] | Freq: Three times a day (TID) | SUBCUTANEOUS | Status: DC
  Administered 2015-11-18: 8 [IU] via SUBCUTANEOUS
  Administered 2015-11-18 (×2): 2 [IU] via SUBCUTANEOUS
  Administered 2015-11-19: 3 [IU] via SUBCUTANEOUS
  Administered 2015-11-19: 11 [IU] via SUBCUTANEOUS
  Filled 2015-11-18: qty 3
  Filled 2015-11-18: qty 11
  Filled 2015-11-18: qty 8
  Filled 2015-11-18 (×2): qty 2

## 2015-11-18 MED ORDER — HEPARIN SODIUM (PORCINE) 5000 UNIT/ML IJ SOLN
5000.0000 [IU] | Freq: Three times a day (TID) | INTRAMUSCULAR | Status: DC
  Administered 2015-11-18 – 2015-11-19 (×4): 5000 [IU] via SUBCUTANEOUS
  Filled 2015-11-18 (×4): qty 1

## 2015-11-18 MED ORDER — LISINOPRIL 10 MG PO TABS
10.0000 mg | ORAL_TABLET | Freq: Every day | ORAL | Status: DC
  Administered 2015-11-18 – 2015-11-19 (×2): 10 mg via ORAL
  Filled 2015-11-18 (×2): qty 1

## 2015-11-18 MED ORDER — CLOPIDOGREL BISULFATE 75 MG PO TABS
75.0000 mg | ORAL_TABLET | Freq: Every day | ORAL | Status: DC
  Administered 2015-11-18 – 2015-11-19 (×2): 75 mg via ORAL
  Filled 2015-11-18 (×2): qty 1

## 2015-11-18 MED ORDER — POTASSIUM CHLORIDE IN NACL 20-0.45 MEQ/L-% IV SOLN
INTRAVENOUS | Status: DC
  Administered 2015-11-18: 06:00:00 via INTRAVENOUS
  Filled 2015-11-18 (×2): qty 1000

## 2015-11-18 MED ORDER — ASPIRIN EC 81 MG PO TBEC
81.0000 mg | DELAYED_RELEASE_TABLET | Freq: Every day | ORAL | Status: DC
  Administered 2015-11-18 – 2015-11-19 (×2): 81 mg via ORAL
  Filled 2015-11-18 (×2): qty 1

## 2015-11-18 MED ORDER — SODIUM CHLORIDE 0.9% FLUSH
3.0000 mL | Freq: Two times a day (BID) | INTRAVENOUS | Status: DC
  Administered 2015-11-19: 3 mL via INTRAVENOUS

## 2015-11-18 MED ORDER — TAMSULOSIN HCL 0.4 MG PO CAPS
0.8000 mg | ORAL_CAPSULE | Freq: Every day | ORAL | Status: DC
  Administered 2015-11-18: 0.8 mg via ORAL
  Filled 2015-11-18: qty 2

## 2015-11-18 MED ORDER — INSULIN GLARGINE 100 UNIT/ML ~~LOC~~ SOLN
6.0000 [IU] | Freq: Two times a day (BID) | SUBCUTANEOUS | Status: DC
  Administered 2015-11-18 – 2015-11-19 (×2): 6 [IU] via SUBCUTANEOUS
  Filled 2015-11-18 (×4): qty 0.06

## 2015-11-18 MED ORDER — ACETAMINOPHEN 650 MG RE SUPP: 650.0000 mg | Freq: Four times a day (QID) | RECTAL | Status: DC | PRN

## 2015-11-18 MED ORDER — FINASTERIDE 5 MG PO TABS
5.0000 mg | ORAL_TABLET | Freq: Every day | ORAL | Status: DC
Start: 2015-11-18 — End: 2015-11-19
  Administered 2015-11-18 – 2015-11-19 (×2): 5 mg via ORAL
  Filled 2015-11-18 (×2): qty 1

## 2015-11-18 MED ORDER — ACETAMINOPHEN 325 MG PO TABS: 650.0000 mg | ORAL_TABLET | Freq: Four times a day (QID) | ORAL | Status: DC | PRN

## 2015-11-18 MED ORDER — ONDANSETRON HCL 4 MG PO TABS: 4.0000 mg | ORAL_TABLET | Freq: Four times a day (QID) | ORAL | Status: DC | PRN

## 2015-11-18 MED ORDER — INSULIN ASPART 100 UNIT/ML ~~LOC~~ SOLN: 0.0000 [IU] | Freq: Every day | SUBCUTANEOUS | Status: DC

## 2015-11-18 NOTE — Progress Notes (Signed)
Patient returning to floor. Made aware by ccmd patient had 11 beat run vtach. Patient resting in bed no distress noted, patient asymptomatic. Will continue to monitor. Current hr 57

## 2015-11-18 NOTE — Progress Notes (Signed)
Placed call X 2 to on call MD.  Dr Tobi BastosPyreddy returned page.  Notified of FSBS 392 on arrival to floors.  Orders given at this time.

## 2015-11-18 NOTE — Progress Notes (Signed)
Patient ID: Chase Flores, male   DOB: 09-22-1923, 82 y.o.   MRN: [id number] [employer] PROGRESS NOTE  Chase Flores [id number]:[id number] DOB: 09-22-1923 DOA: 11/17/2015 PCP: Rozanna BoxBABAOFF, MARC E, MD  HPI/Subjective: Patient is able to speak better today than yesterday. Yesterday had difficulty speaking and difficulty thinking of what words to say. Today he is able to get out his words. He does not remember if he had weakness one side versus the other yesterday. He passed a swallow evaluation today. Wife states that he had a low sugar of 29 yesterday during when this episode was going on.  Objective: Filed Vitals:   11/18/15 0405 11/18/15 1219  BP: 165/45 142/54  Pulse: 66 49  Temp: 98.1 F (36.7 C) 97.8 F (36.6 C)  Resp: 20 16    Filed Weights   11/17/15 2002 11/17/15 2100 11/18/15 0405  Weight: 65.772 kg (145 lb) 65.137 kg (143 lb 9.6 oz) 60.283 kg (132 lb 14.4 oz)    ROS: Review of Systems  Constitutional: Negative for fever and chills.  Eyes: Negative for blurred vision.  Respiratory: Negative for cough and shortness of breath.   Cardiovascular: Negative for chest pain.  Gastrointestinal: Negative for nausea, vomiting, abdominal pain, diarrhea and constipation.  Genitourinary: Negative for dysuria.  Musculoskeletal: Negative for joint pain.  Neurological: Positive for speech change. Negative for dizziness and headaches.   Exam: Physical Exam  Constitutional: He is oriented to person, place, and time.  HENT:  Nose: No mucosal edema.  Mouth/Throat: No oropharyngeal exudate or posterior oropharyngeal edema.  Eyes: Conjunctivae, EOM and lids are normal. Pupils are equal, round, and reactive to light.  Neck: No JVD present. Carotid bruit is not present. No edema present. No thyroid mass and no thyromegaly present.  Cardiovascular: S1 normal and S2 normal.  Exam reveals no gallop.   No murmur heard. Pulses:      Dorsalis pedis pulses are 2+ on the right side, and 2+ on the left  side.  Respiratory: No respiratory distress. He has no wheezes. He has no rhonchi. He has no rales.  GI: Soft. Bowel sounds are normal. There is no tenderness.  Musculoskeletal:       Right ankle: He exhibits no swelling.       Left ankle: He exhibits no swelling.  Lymphadenopathy:    He has no cervical adenopathy.  Neurological: He is alert and oriented to person, place, and time. No cranial nerve deficit.  Patient able to straight leg raise bilaterally without a problem. Power 5 out of 5 upper and lower extremities.  Skin: Skin is warm. No rash noted. Nails show no clubbing.  Psychiatric: He has a normal mood and affect.      Data Reviewed: Basic Metabolic Panel:  Recent Labs Lab 11/17/15 1957 11/18/15 0612  NA 138 136  K 4.8 4.3  CL 102 105  CO2 28 25  GLUCOSE 273* 402*  BUN 39* 33*  CREATININE 0.94 0.89  CALCIUM 8.8* 8.1*   Liver Function Tests:  Recent Labs Lab 11/17/15 1957  AST 25  ALT 19  ALKPHOS 79  BILITOT 1.0  PROT 6.9  ALBUMIN 3.8   CBC:  Recent Labs Lab 11/17/15 1957 11/18/15 0612  WBC 7.1 7.6  NEUTROABS 4.6  --   HGB 12.9* 11.4*  HCT 38.1* 33.9*  MCV 98.8 97.6  PLT 179 149*   Cardiac Enzymes:  Recent Labs Lab 11/17/15 1957  TROPONINI <0.03    CBG:  Recent Labs Lab  11/17/15 2229 11/18/15 0322 11/18/15 0401 11/18/15 0807 11/18/15 1221  GLUCAP 262* 370* 392* 271* 147*     Studies: Ct Angio Head W/cm &/or Wo Cm  11/17/2015  CLINICAL DATA:  Confusion and difficulty speaking for 30 minutes. Symptoms started at approximately 1800 hours. Garbled speech. Follow-up code stroke. History of diabetes, hypertension. EXAM: CT ANGIOGRAPHY HEAD TECHNIQUE: Multidetector CT imaging of the head was performed using the standard protocol during bolus administration of intravenous contrast. Multiplanar CT image reconstructions and MIPs were obtained to evaluate the vascular anatomy. CONTRAST:  75 cc Isovue 370 COMPARISON:  CT HEAD November 17, 2015  at 2000 hours. FINDINGS: ANTERIOR CIRCULATION: Patent cervical internal carotid arteries, petrous, cavernous and supra clinoid internal carotid arteries. Calcific atherosclerosis resulting in severe stenosis bilateral mid supraclinoid internal carotid arteries. Anterior and middle cerebral arteries are patent. Moderate to severe stenosis distal LEFT M1 segment. POSTERIOR CIRCULATION: RIGHT vertebral artery is dominant with patent vertebral arteries, vertebrobasilar junction and basilar artery, as well as main branch vessels. Severe stenosis LEFT mid V4 segment with luminal irregularity of the RIGHT V4 segment. Mild luminal regularity stenosis of the basilar artery. Tiny RIGHT and probably LEFT posterior communicating arteries. Patent posterior cerebral arteries with focal severe stenosis proximal RIGHT P3 segment. No large vessel occlusion, dissection, contrast extravasation or aneurysm within the anterior nor posterior circulation. No abnormal intracranial enhancement. IMPRESSION: No emergent large vessel occlusion. Severe stenoses bilateral supraclinoid internal carotid arteries. Additional high-grade stenoses of the anterior and posterior circulation, compatible with atherosclerosis. Electronically Signed   By: Awilda Metroourtnay  Bloomer M.D.   On: 11/17/2015 22:31   Ct Head Wo Contrast  11/17/2015  CLINICAL DATA:  Code stroke. Pt bib EMS w/ c/o confusion and difficulty speaking that started 30 minutes ago. Pt worse upon arrival to ED per EMS. Per EMS, pt able to stand and walk to truck. Wife sts that s/s start approx 1800. She sts that.*comment was truncated* EXAM: CT HEAD WITHOUT CONTRAST TECHNIQUE: Contiguous axial images were obtained from the base of the skull through the vertex without intravenous contrast. COMPARISON:  Brain MRI 04/15/2014 head CT 03/2018 15 FINDINGS: No acute intracranial hemorrhage. No focal mass lesion. No CT evidence of acute infarction. No midline shift or mass effect. No hydrocephalus.  Basilar cisterns are patent. Generalized cortical atrophy. There is volume loss in the cerebellum. Proportional ventricular dilatation Paranasal sinuses and mastoid air cells are clear. Orbits are clear. IMPRESSION: 1. No CT evidence of acute cortical infarction. 2. No intracranial hemorrhage. 3. Marked generalized cortical atrophy. Findings conveyed toERYKA GAYLE on 11/17/2015  at20:19. Electronically Signed   By: Genevive BiStewart  Edmunds M.D.   On: 11/17/2015 20:19   [place] Carotid Bilateral  11/18/2015  CLINICAL DATA:  Stroke. EXAM: BILATERAL CAROTID DUPLEX ULTRASOUND TECHNIQUE: Wallace CullensGray scale imaging, color Doppler and duplex ultrasound were performed of bilateral carotid and vertebral arteries in the neck. COMPARISON:  None. FINDINGS: Criteria: Quantification of carotid stenosis is based on velocity parameters that correlate the residual internal carotid diameter with NASCET-based stenosis levels, using the diameter of the distal internal carotid lumen as the denominator for stenosis measurement. The following velocity measurements were obtained: RIGHT ICA:  275 cm/sec CCA:  82 cm/sec SYSTOLIC ICA/CCA RATIO:  3.3 DIASTOLIC ICA/CCA RATIO:  5.2 ECA:  187 cm/sec LEFT ICA:  123 cm/sec CCA:  101 cm/sec SYSTOLIC ICA/CCA RATIO:  1.2 DIASTOLIC ICA/CCA RATIO:  3.3 ECA:  175 cm/sec RIGHT CAROTID ARTERY: Calcified and heterogeneous plaque is seen within the right common carotid  artery, at the right carotid bulb and within the proximal right ICA. Most significant stenosis is caused by plaque within the proximal right ICA. RIGHT VERTEBRAL ARTERY:  Appropriate antegrade flow LEFT CAROTID ARTERY: Scattered calcified and heterogeneous plaque within the distal left common carotid artery, at the left carotid bulb and within the proximal left ICA, all of which is less prominent than on the right. LEFT VERTEBRAL ARTERY:  Appropriate antegrade flow. IMPRESSION: 1. Calcified and heterogeneous plaque within the proximal RIGHT ICA causing a high  grade stenosis (greater than 70% stenosis) based on peak systolic velocity measurement of 275 centimeters/second. However, based on the ICA to CCA ratio of 3.3 and the ICA end-diastolic velocity of 30 centimeters/second, the stenosis may be slightly less than 70%. Qualitatively, the stenosis caused by the plaque in the proximal right ICA appears significant. 2. No evidence of hemodynamically significant stenosis within the left common carotid or internal carotid arteries. 3. Normal anterograde flow within the bilateral vertebral arteries. Electronically Signed   By: Bary Nicie Milan M.D.   On: 11/18/2015 11:39    Scheduled Meds: . aspirin EC  81 mg Oral Daily  . atorvastatin  40 mg Oral QHS  . clopidogrel  75 mg Oral Daily  . finasteride  5 mg Oral Daily  . heparin  5,000 Units Subcutaneous Q8H  . insulin aspart  0-15 Units Subcutaneous TID WC  . insulin aspart  0-5 Units Subcutaneous QHS  . insulin glargine  6 Units Subcutaneous BID  . lisinopril  10 mg Oral Daily  . sodium chloride flush  3 mL Intravenous Q12H  . tamsulosin  0.8 mg Oral QPC supper    Assessment/Plan:  1. Slurred speech. This can either be a stroke versus hypoglycemic episode. I am still awaiting MRI of the brain. Patient on aspirin and Plavix and statin. 2. Type 1 diabetes with hypoglycemic episode yesterday. Hemoglobin A1c 7.7. I will cut back on Lantus insulin to 6 units twice a day. Continue sliding scale. If MRI of the brain is negative for stroke it's likely the hypoglycemia that caused the slurred speech. 3. Carotid stenosis. I'm awaiting MRI of the brain. If no stroke is seen in the carotid stenosis can be handled as outpatient. If there is a stroke I may end up getting a CT angiogram the carotids tomorrow. 4. BPH on finasteride 5. Parkinson's disease 6. Essential hypertension on lisinopril 7. 7 beat nonsustained V. tach with normal echocardiogram. I replaced magnesium IV. Monitor electrolytes  tomorrow. 8. Bradycardia. Not on any rate controlling medication.  Code Status:     Code Status Orders        Start     Ordered   11/18/15 0411  Full code   Continuous     11/18/15 0410    Code Status History    Date Active Date Inactive Code Status Order ID Comments User Context   This patient has a current code status but no historical code status.    Advance Directive Documentation        Most Recent Value   Type of Advance Directive  Healthcare Power of Attorney, Living will   Pre-existing out of facility DNR order (yellow form or pink MOST form)     "MOST" Form in Place?       Family Communication: Wife at the bedside Disposition Plan: Potential home tomorrow based on testing results.  Consultants:  Neurology  Time spent: 35 minutes  Alford Highland  [employer]

## 2015-11-18 NOTE — ED Notes (Signed)
Called floor to let them know pt on the way up 

## 2015-11-18 NOTE — Consult Note (Signed)
Referring Physician: Joneen Roach    Chief Complaint: Slurred speech  HPI: Chase Flores is an 82 y.o. male who was at home with his wife yesterday after appointments in [place].  Noted when he came into the kitchen that he had incomprehensible speech.  Patient was brought to the ED where he was evaluated.  Option was given for tPA which he and his wife refused.  Initial NIHSS of 7.   At baseline patient performs own ADL's, is continent and ambulates with a walker.    Date last known well: 11/17/2015 Time last known well: Time: 17:30 tPA Given: No: Patient refusal  Past Medical History  Diagnosis Date  . Diabetes mellitus without complication (HCC)     type 1  . Enlarged prostate   . Parkinson disease (HCC)   . Frequency of urination     History reviewed. No pertinent past surgical history.  Family history: Both parents deceased.  Motherr with PD.  Father with lung disease and heart disease.  Brother with a history of DM.    Social History:  reports that he has never smoked. He does not have any smokeless tobacco history on file. He reports that he does not drink alcohol. His drug history is not on file.  Allergies: No Known Allergies  Medications:  I have reviewed the patient's current medications. Prior to Admission:  Prescriptions prior to admission  Medication Sig Dispense Refill Last Dose  . acarbose (PRECOSE) 50 MG tablet Take 25-50 mg by mouth at bedtime.    11/16/2015 at Unknown time  . amLODipine (NORVASC) 2.5 MG tablet Take 2.5 mg by mouth daily.    11/17/2015 at Unknown time  . Ascorbic Acid (VITAMIN C WITH ROSE HIPS) 500 MG tablet Take 500 mg by mouth daily.   11/17/2015 at Unknown time  . aspirin EC 81 MG tablet Take 81 mg by mouth daily.    11/17/2015 at 0800  . atorvastatin (LIPITOR) 80 MG tablet Take 40-80 mg by mouth at bedtime.    11/16/2015 at Unknown time  . cholecalciferol (VITAMIN D) 1000 units tablet Take 1,000 Units by mouth daily.   11/17/2015 at Unknown time   . finasteride (PROSCAR) 5 MG tablet Take 5 mg by mouth daily.    11/17/2015 at Unknown time  . fluticasone (FLONASE) 50 MCG/ACT nasal spray Place 2 sprays into both nostrils daily.   11/17/2015 at Unknown time  . folic acid (FOLVITE) 400 MCG tablet Take 400 mcg by mouth daily.    11/17/2015 at Unknown time  . insulin glargine (LANTUS) 100 UNIT/ML injection Inject 7 Units into the skin 2 (two) times daily.    11/17/2015 at Unknown time  . insulin lispro (HUMALOG) 100 UNIT/ML injection Inject 7 Units into the skin 3 (three) times daily before meals. Pt is to increase units based on BS readings.   11/17/2015 at Unknown time  . lisinopril (PRINIVIL,ZESTRIL) 10 MG tablet Take 10 mg by mouth daily.    11/17/2015 at Unknown time  . mirabegron ER (MYRBETRIQ) 50 MG TB24 tablet Take 50 mg by mouth daily.    11/17/2015 at Unknown time  . sodium fluoride (DENTA 5000 PLUS) 1.1 % CREA dental cream Place 1 application onto teeth at bedtime.   11/16/2015 at Unknown time  . tamsulosin (FLOMAX) 0.4 MG CAPS capsule Take 0.8 mg by mouth daily after supper.    11/16/2015 at Unknown time   Scheduled: . atorvastatin  40 mg Oral QHS  . dipyridamole-aspirin  1  capsule Oral BID  . finasteride  5 mg Oral Daily  . heparin  5,000 Units Subcutaneous Q8H  . insulin aspart  0-15 Units Subcutaneous TID WC  . insulin aspart  0-5 Units Subcutaneous QHS  . insulin glargine  7 Units Subcutaneous BID  . lisinopril  10 mg Oral Daily  . sodium chloride flush  3 mL Intravenous Q12H  . tamsulosin  0.8 mg Oral QPC supper    ROS: History obtained from the patient  General ROS: negative for - chills, fatigue, fever, night sweats, weight gain or weight loss Psychological ROS: negative for - behavioral disorder, hallucinations, memory difficulties, mood swings or suicidal ideation Ophthalmic ROS: negative for - blurry vision, double vision, eye pain or loss of vision ENT ROS: negative for - epistaxis, nasal discharge, oral lesions, sore  throat, tinnitus or vertigo Allergy and Immunology ROS: negative for - hives or itchy/watery eyes Hematological and Lymphatic ROS: negative for - bleeding problems, bruising or swollen lymph nodes Endocrine ROS: negative for - galactorrhea, hair pattern changes, polydipsia/polyuria or temperature intolerance Respiratory ROS: negative for - cough, hemoptysis, shortness of breath or wheezing Cardiovascular ROS: negative for - chest pain, dyspnea on exertion, edema or irregular heartbeat Gastrointestinal ROS: negative for - abdominal pain, diarrhea, hematemesis, nausea/vomiting or stool incontinence Genito-Urinary ROS: negative for - dysuria, hematuria, incontinence or urinary frequency/urgency Musculoskeletal ROS: negative for - joint swelling or muscular weakness Neurological ROS: as noted in HPI Dermatological ROS: negative for rash and skin lesion changes  Physical Examination: Blood pressure 165/45, pulse 66, temperature 98.1 F (36.7 C), temperature source Oral, resp. rate 20, height 5\' 8"  (1.727 m), weight 60.283 kg (132 lb 14.4 oz), SpO2 96 %.  HEENT-  Normocephalic, no lesions, without obvious abnormality.  Normal external eye and conjunctiva.  Normal TM's bilaterally.  Normal auditory canals and external ears. Normal external nose, mucus membranes and septum.  Normal pharynx. Cardiovascular- S1, S2 normal, pulses palpable throughout   Lungs- chest clear, no wheezing, rales, normal symmetric air entry Abdomen- soft, non-tender; bowel sounds normal; no masses,  no organomegaly Extremities- no edema Lymph-no adenopathy palpable Musculoskeletal-no joint tenderness, deformity or swelling Skin-warm and dry, no hyperpigmentation, vitiligo, or suspicious lesions  Neurological Examination Mental Status: Alert, oriented, thought content appropriate.  Speech fluent but at times does appear to be searching for words.  Able to follow 3 step commands without difficulty. Cranial Nerves: II:  Discs flat bilaterally; Visual fields grossly normal, pupils  irregular and unreactive.   III,IV, VI: left ptosis, extra-ocular motions intact bilaterally V,VII: decrease in right NLF, facial light touch sensation normal bilaterally VIII: HOH IX,X: gag reflex present XI: bilateral shoulder shrug XII: midline tongue extension Motor: Right : Upper extremity   5/5    Left:     Upper extremity   5/5  Lower extremity   5/5     Lower extremity   5/5 Tone and bulk:normal tone throughout; no atrophy noted Sensory: Pinprick and light touch intact throughout, bilaterally Deep Tendon Reflexes: 2+ and symmetric with absent AJ's bilaterally Plantars: Right: upgoing   Left: upgoing Cerebellar: Normal finger-to-nose testing bilaterally Gait: not tested due to safety concerns   Laboratory Studies:  Basic Metabolic Panel:  Recent Labs Lab 11/17/15 1957 11/18/15 0612  NA 138 136  K 4.8 4.3  CL 102 105  CO2 28 25  GLUCOSE 273* 402*  BUN 39* 33*  CREATININE 0.94 0.89  CALCIUM 8.8* 8.1*    Liver Function Tests:  Recent Labs  Lab 11/17/15 1957  AST 25  ALT 19  ALKPHOS 79  BILITOT 1.0  PROT 6.9  ALBUMIN 3.8   No results for input(s): LIPASE, AMYLASE in the last 168 hours. No results for input(s): AMMONIA in the last 168 hours.  CBC:  Recent Labs Lab 11/17/15 1957 11/18/15 0612  WBC 7.1 7.6  NEUTROABS 4.6  --   HGB 12.9* 11.4*  HCT 38.1* 33.9*  MCV 98.8 97.6  PLT 179 149*    Cardiac Enzymes:  Recent Labs Lab 11/17/15 1957  TROPONINI <0.03    BNP: Invalid input(s): POCBNP  CBG:  Recent Labs Lab 11/17/15 2010 11/17/15 2229 11/18/15 0322 11/18/15 0401  GLUCAP 256* 262* 370* 392*    Microbiology: No results found for this or any previous visit.  Coagulation Studies:  Recent Labs  11/17/15 1957  LABPROT 13.2  INR 0.98    Urinalysis:  Recent Labs Lab 11/17/15 2009  COLORURINE STRAW*  LABSPEC 1.010  PHURINE 9.0*  GLUCOSEU >500*  HGBUR  NEGATIVE  BILIRUBINUR NEGATIVE  KETONESUR NEGATIVE  PROTEINUR NEGATIVE  NITRITE NEGATIVE  LEUKOCYTESUR NEGATIVE    Lipid Panel:    Component Value Date/Time   CHOL 125 11/18/2015 0612   CHOL 108 04/15/2014 0355   TRIG 37 11/18/2015 0612   TRIG 87 04/15/2014 0355   HDL 57 11/18/2015 0612   HDL 49 04/15/2014 0355   CHOLHDL 2.2 11/18/2015 0612   VLDL 7 11/18/2015 0612   VLDL 17 04/15/2014 0355   LDLCALC 61 11/18/2015 0612   LDLCALC 42 04/15/2014 0355    HgbA1C:  Lab Results  Component Value Date   HGBA1C 8.1* 04/15/2014    Urine Drug Screen:  No results found for: LABOPIA, COCAINSCRNUR, LABBENZ, AMPHETMU, THCU, LABBARB  Alcohol Level: No results for input(s): ETH in the last 168 hours.  Other results: EKG: sinus rhythm at 87 bpm.  Imaging: Ct Angio Head W/cm &/or Wo Cm  11/17/2015  CLINICAL DATA:  Confusion and difficulty speaking for 30 minutes. Symptoms started at approximately 1800 hours. Garbled speech. Follow-up code stroke. History of diabetes, hypertension. EXAM: CT ANGIOGRAPHY HEAD TECHNIQUE: Multidetector CT imaging of the head was performed using the standard protocol during bolus administration of intravenous contrast. Multiplanar CT image reconstructions and MIPs were obtained to evaluate the vascular anatomy. CONTRAST:  75 cc Isovue 370 COMPARISON:  CT HEAD November 17, 2015 at 2000 hours. FINDINGS: ANTERIOR CIRCULATION: Patent cervical internal carotid arteries, petrous, cavernous and supra clinoid internal carotid arteries. Calcific atherosclerosis resulting in severe stenosis bilateral mid supraclinoid internal carotid arteries. Anterior and middle cerebral arteries are patent. Moderate to severe stenosis distal LEFT M1 segment. POSTERIOR CIRCULATION: RIGHT vertebral artery is dominant with patent vertebral arteries, vertebrobasilar junction and basilar artery, as well as main branch vessels. Severe stenosis LEFT mid V4 segment with luminal irregularity of the RIGHT V4  segment. Mild luminal regularity stenosis of the basilar artery. Tiny RIGHT and probably LEFT posterior communicating arteries. Patent posterior cerebral arteries with focal severe stenosis proximal RIGHT P3 segment. No large vessel occlusion, dissection, contrast extravasation or aneurysm within the anterior nor posterior circulation. No abnormal intracranial enhancement. IMPRESSION: No emergent large vessel occlusion. Severe stenoses bilateral supraclinoid internal carotid arteries. Additional high-grade stenoses of the anterior and posterior circulation, compatible with atherosclerosis. Electronically Signed   By: Awilda Metroourtnay  Bloomer M.D.   On: 11/17/2015 22:31   Ct Head Wo Contrast  11/17/2015  CLINICAL DATA:  Code stroke. Pt bib EMS w/ c/o confusion and difficulty speaking  that started 30 minutes ago. Pt worse upon arrival to ED per EMS. Per EMS, pt able to stand and walk to truck. Wife sts that s/s start approx 1800. She sts that.*comment was truncated* EXAM: CT HEAD WITHOUT CONTRAST TECHNIQUE: Contiguous axial images were obtained from the base of the skull through the vertex without intravenous contrast. COMPARISON:  Brain MRI 04/15/2014 head CT 03/2018 15 FINDINGS: No acute intracranial hemorrhage. No focal mass lesion. No CT evidence of acute infarction. No midline shift or mass effect. No hydrocephalus. Basilar cisterns are patent. Generalized cortical atrophy. There is volume loss in the cerebellum. Proportional ventricular dilatation Paranasal sinuses and mastoid air cells are clear. Orbits are clear. IMPRESSION: 1. No CT evidence of acute cortical infarction. 2. No intracranial hemorrhage. 3. Marked generalized cortical atrophy. Findings conveyed toERYKA GAYLE on 11/17/2015  at20:19. Electronically Signed   By: Genevive Bi M.D.   On: 11/17/2015 20:19    Assessment: 82 y.o. male presenting with slurred speech.  Speech improved this morning but wife reports that she dose not feel that it is back  to baseline.  Head CT personally reviewed and shows no acute changes.  CTA of the head shows no large vessel occlusion but does show evidence of diffuse significant small vessel disease. Patient on ASA at home.    Stroke Risk Factors - diabetes mellitus  Plan: 1. HgbA1c, fasting lipid panel pending 2. MRI of the brain without contrast 3. PT consult, OT consult, Speech consult 4. Echocardiogram 5. Carotid dopplers 6. Prophylactic therapy-ASA 81 mg to be continued and Plavix  to be added.  Will change to Plavix alone after about 3 months.  Will D/C Aggrenox.   7. NPO until RN stroke swallow screen 8. Telemetry monitoring 9. Frequent neuro checks    Thana Farr, MD Neurology [phone] 11/18/2015, 7:47 AM

## 2015-11-18 NOTE — Progress Notes (Addendum)
Dr. Renae Glosswieting on the floor. md made aware of run of vtach and heart rate in the high 40-low 50. md to place orders as needed

## 2015-11-18 NOTE — Evaluation (Signed)
Clinical/Bedside Swallow Evaluation Patient Details  Name: Chase GladeRoy E Sheckler MRN: [id number] Date of Birth: 1923/08/20  Today's Date: 11/18/2015 Time: SLP Start Time (ACUTE ONLY): 0845 SLP Stop Time (ACUTE ONLY): 0918 SLP Time Calculation (min) (ACUTE ONLY): 33 min  Past Medical History:  Past Medical History  Diagnosis Date  . Diabetes mellitus without complication (HCC)     type 1  . Enlarged prostate   . Parkinson disease (HCC)   . Frequency of urination    Past Surgical History: History reviewed. No pertinent past surgical history. HPI:      Assessment / Plan / Recommendation Clinical Impression  pt presents with minimal risk for aspiration characterized by throat clear x 1 with thin liquids via straw. pt states he does not typically use a straw. pt with no overt ssx aspiration with thin via cup or solids. pt able to utilize functional mastication  with slow oral intake and alternating of solids and liquids without cues. pt recommended to return to regular textures with thin liquids via cup, no straw. ST educated pt and wife and nursing all verbally agreed.     Aspiration Risk  Mild aspiration risk    Diet Recommendation Regular;Thin liquid   Liquid Administration via: No straw;Cup Medication Administration: Whole meds with liquid Supervision: Patient able to self feed Compensations: Slow rate;Small sips/bites;Follow solids with liquid Postural Changes: Seated upright at 90 degrees    Other  Recommendations Oral Care Recommendations: Patient independent with oral care   Follow up Recommendations       Frequency and Duration min 2x/week  1 week       Prognosis Prognosis for Safe Diet Advancement: Good      Swallow Study   General Date of Onset: 11/17/15 Type of Study: Bedside Swallow Evaluation Diet Prior to this Study: NPO Temperature Spikes Noted: No Respiratory Status: Room air History of Recent Intubation: No Behavior/Cognition: Alert;Cooperative;Pleasant  mood Oral Care Completed by SLP: No Oral Cavity - Dentition: Adequate natural dentition Vision: Functional for self-feeding Self-Feeding Abilities: Able to feed self Patient Positioning: Upright in bed Baseline Vocal Quality: Normal Volitional Cough: Strong Volitional Swallow: Able to elicit    Oral/Motor/Sensory Function Overall Oral Motor/Sensory Function: Within functional limits   Ice Chips Ice chips: Within functional limits Presentation: Cup;Spoon;Self Fed   Thin Liquid Thin Liquid: Within functional limits Presentation: Cup;Self Fed;Straw    Nectar Thick Nectar Thick Liquid: Not tested   Honey Thick Honey Thick Liquid: Not tested   Puree Puree: Within functional limits Presentation: Self Fed;Spoon   Solid   GO   Solid: Within functional limits Presentation: Self Fed;Spoon        Meredith PelStacie Harris Sauber 11/18/2015,10:52 AM

## 2015-11-18 NOTE — H&P (Signed)
PCP:   BABAOFF, Lavada MesiMARC E, MD   Chief Complaint:  Incoherent speech  HPI: This is a 82 year old gentleman left the dinner this evening came to kitchen and spoke and spoke to his wife. His speech was incoherent. She pulled emergency coronary he was brought to the ER. In the ER his speech remains incoherent. He does follow instruction. Discussions were had between the wife and the ER physician and she declined TPA. The patient has no musculoskeletal weakness.  Review of Systems:  The patient denies anorexia, fever, weight loss,, vision loss, decreased hearing, hoarseness, chest pain, syncope, dyspnea on exertion, peripheral edema, balance deficits, hemoptysis, abdominal pain, melena, hematochezia, severe indigestion/heartburn, hematuria, incontinence, genital sores, muscle weakness, suspicious skin lesions, transient blindness, difficulty walking, incoherent speech. depression, unusual weight change, abnormal bleeding, enlarged lymph nodes, angioedema, and breast masses.  Past Medical History: Past Medical History  Diagnosis Date  . Diabetes mellitus without complication (HCC)     type 1  . Enlarged prostate   . Parkinson disease (HCC)   . Frequency of urination    History reviewed. No pertinent past surgical history.  Medications: Prior to Admission medications   Medication Sig Start Date End Date Taking? Authorizing Provider  acarbose (PRECOSE) 50 MG tablet Take 25-50 mg by mouth at bedtime.    Yes Historical Provider, MD  amLODipine (NORVASC) 2.5 MG tablet Take 2.5 mg by mouth daily.    Yes Historical Provider, MD  Ascorbic Acid (VITAMIN C WITH ROSE HIPS) 500 MG tablet Take 500 mg by mouth daily.   Yes Historical Provider, MD  aspirin EC 81 MG tablet Take 81 mg by mouth daily.    Yes Historical Provider, MD  atorvastatin (LIPITOR) 80 MG tablet Take 40-80 mg by mouth at bedtime.    Yes Historical Provider, MD  cholecalciferol (VITAMIN D) 1000 units tablet Take 1,000 Units by mouth daily.    Yes Historical Provider, MD  finasteride (PROSCAR) 5 MG tablet Take 5 mg by mouth daily.    Yes Historical Provider, MD  fluticasone (FLONASE) 50 MCG/ACT nasal spray Place 2 sprays into both nostrils daily.   Yes Historical Provider, MD  folic acid (FOLVITE) 400 MCG tablet Take 400 mcg by mouth daily.    Yes Historical Provider, MD  insulin glargine (LANTUS) 100 UNIT/ML injection Inject 7 Units into the skin 2 (two) times daily.    Yes Historical Provider, MD  insulin lispro (HUMALOG) 100 UNIT/ML injection Inject 7 Units into the skin 3 (three) times daily before meals. Pt is to increase units based on BS readings.   Yes Historical Provider, MD  lisinopril (PRINIVIL,ZESTRIL) 10 MG tablet Take 10 mg by mouth daily.    Yes Historical Provider, MD  mirabegron ER (MYRBETRIQ) 50 MG TB24 tablet Take 50 mg by mouth daily.    Yes Historical Provider, MD  sodium fluoride (DENTA 5000 PLUS) 1.1 % CREA dental cream Place 1 application onto teeth at bedtime.   Yes Historical Provider, MD  tamsulosin (FLOMAX) 0.4 MG CAPS capsule Take 0.8 mg by mouth daily after supper.    Yes Historical Provider, MD    Allergies:  No Known Allergies  Social History:  reports that he has never smoked. He does not have any smokeless tobacco history on file. He reports that he does not drink alcohol. His drug history is not on file.  Family History: Parkinson's disease, diabetes mellitus  Physical Exam: Filed Vitals:   11/17/15 2100 11/17/15 2230 11/17/15 2331 11/18/15 0057  BP:  192/68 166/69 170/55  Pulse:  83 84 79  Temp:   99.5 F (37.5 C)   TempSrc:   Oral   Resp:   16 19  Weight: 65.137 kg (143 lb 9.6 oz)     SpO2:  95% 97% 95%    General:  Awake male, incoherent speech, well developed and nourished, no acute distress Eyes: PERRLA, pink conjunctiva, no scleral icterus ENT: Moist oral mucosa, neck supple, no thyromegaly, Carotid murmurs greater on the right side Lungs: clear to ascultation, no wheeze, no  crackles, no use of accessory muscles Cardiovascular: regular rate and rhythm, no regurgitation, no gallops, murmurs. No carotid bruits, no JVD Abdomen: soft, positive BS, non-tender, non-distended, no organomegaly, not an acute abdomen GU: not examined Neuro: CN II - XII grossly intact, sensation intact Musculoskeletal: strength 5/5 all extremities, no clubbing, cyanosis or edema Skin: no rash, no subcutaneous crepitation, no decubitus Psych:Incoherent speech    Labs on Admission:   Recent Labs  11/17/15 1957  NA 138  K 4.8  CL 102  CO2 28  GLUCOSE 273*  BUN 39*  CREATININE 0.94  CALCIUM 8.8*    Recent Labs  11/17/15 1957  AST 25  ALT 19  ALKPHOS 79  BILITOT 1.0  PROT 6.9  ALBUMIN 3.8   No results for input(s): LIPASE, AMYLASE in the last 72 hours.  Recent Labs  11/17/15 1957  WBC 7.1  NEUTROABS 4.6  HGB 12.9*  HCT 38.1*  MCV 98.8  PLT 179    Recent Labs  11/17/15 1957  TROPONINI <0.03   Invalid input(s): POCBNP No results for input(s): DDIMER in the last 72 hours. No results for input(s): HGBA1C in the last 72 hours. No results for input(s): CHOL, HDL, LDLCALC, TRIG, CHOLHDL, LDLDIRECT in the last 72 hours. No results for input(s): TSH, T4TOTAL, T3FREE, THYROIDAB in the last 72 hours.  Invalid input(s): FREET3 No results for input(s): VITAMINB12, FOLATE, FERRITIN, TIBC, IRON, RETICCTPCT in the last 72 hours.  Micro Results: No results found for this or any previous visit (from the past 240 hour(s)).   Radiological Exams on Admission: Ct Angio Head W/cm &/or Wo Cm  11/17/2015  CLINICAL DATA:  Confusion and difficulty speaking for 30 minutes. Symptoms started at approximately 1800 hours. Garbled speech. Follow-up code stroke. History of diabetes, hypertension. EXAM: CT ANGIOGRAPHY HEAD TECHNIQUE: Multidetector CT imaging of the head was performed using the standard protocol during bolus administration of intravenous contrast. Multiplanar CT image  reconstructions and MIPs were obtained to evaluate the vascular anatomy. CONTRAST:  75 cc Isovue 370 COMPARISON:  CT HEAD November 17, 2015 at 2000 hours. FINDINGS: ANTERIOR CIRCULATION: Patent cervical internal carotid arteries, petrous, cavernous and supra clinoid internal carotid arteries. Calcific atherosclerosis resulting in severe stenosis bilateral mid supraclinoid internal carotid arteries. Anterior and middle cerebral arteries are patent. Moderate to severe stenosis distal LEFT M1 segment. POSTERIOR CIRCULATION: RIGHT vertebral artery is dominant with patent vertebral arteries, vertebrobasilar junction and basilar artery, as well as main branch vessels. Severe stenosis LEFT mid V4 segment with luminal irregularity of the RIGHT V4 segment. Mild luminal regularity stenosis of the basilar artery. Tiny RIGHT and probably LEFT posterior communicating arteries. Patent posterior cerebral arteries with focal severe stenosis proximal RIGHT P3 segment. No large vessel occlusion, dissection, contrast extravasation or aneurysm within the anterior nor posterior circulation. No abnormal intracranial enhancement. IMPRESSION: No emergent large vessel occlusion. Severe stenoses bilateral supraclinoid internal carotid arteries. Additional high-grade stenoses of the anterior and posterior circulation,  compatible with atherosclerosis. Electronically Signed   By: Awilda Metroourtnay  Bloomer M.D.   On: 11/17/2015 22:31   Ct Head Wo Contrast  11/17/2015  CLINICAL DATA:  Code stroke. Pt bib EMS w/ c/o confusion and difficulty speaking that started 30 minutes ago. Pt worse upon arrival to ED per EMS. Per EMS, pt able to stand and walk to truck. Wife sts that s/s start approx 1800. She sts that.*comment was truncated* EXAM: CT HEAD WITHOUT CONTRAST TECHNIQUE: Contiguous axial images were obtained from the base of the skull through the vertex without intravenous contrast. COMPARISON:  Brain MRI 04/15/2014 head CT 03/2018 15 FINDINGS: No acute  intracranial hemorrhage. No focal mass lesion. No CT evidence of acute infarction. No midline shift or mass effect. No hydrocephalus. Basilar cisterns are patent. Generalized cortical atrophy. There is volume loss in the cerebellum. Proportional ventricular dilatation Paranasal sinuses and mastoid air cells are clear. Orbits are clear. IMPRESSION: 1. No CT evidence of acute cortical infarction. 2. No intracranial hemorrhage. 3. Marked generalized cortical atrophy. Findings conveyed toERYKA GAYLE on 11/17/2015  at20:19. Electronically Signed   By: Genevive BiStewart  Edmunds M.D.   On: 11/17/2015 20:19    Assessment/Plan Present on Admission:  . Slurred speech  -Likely CVA. Admit to MedSurg with telemetry -Neurochecks ordered, swelling evaluation, nothing by mouth -When necessary blood pressure medications in the a.m. -MRI brain, carotid ultrasounds  . HTN (hypertension), malignant -Permissive hypertension, Lopressor when necessary for systolic blood pressure greater than 190  . bilateral supraclinoid internal carotid arteries. Additional high-grade stenoses of the anterior and posterior circulation, -Aggrenox ordered. consult neurologist in AM -Continue simvastatin 80 mg daily  . Hypercholesterolemia -Lipid panel in a.m., resume patient's cholesterol medications once patient's swollow has been cleared  . Parkinsonian features -Stable, aware  . Type 1 diabetes mellitus (HCC) -Nothing by mouth, sliding-scale insulin every 6 hours -Patient does have a history of hypoglycemia. Monitor carefully   Lateria Alderman 11/18/2015, 1:03 AM

## 2015-11-19 ENCOUNTER — Inpatient Hospital Stay: Payer: Medicare Other

## 2015-11-19 ENCOUNTER — Encounter: Payer: Self-pay | Admitting: Radiology

## 2015-11-19 LAB — GLUCOSE, CAPILLARY
GLUCOSE-CAPILLARY: 184 mg/dL — AB (ref 65–99)
Glucose-Capillary: 171 mg/dL — ABNORMAL HIGH (ref 65–99)
Glucose-Capillary: 199 mg/dL — ABNORMAL HIGH (ref 65–99)
Glucose-Capillary: 308 mg/dL — ABNORMAL HIGH (ref 65–99)

## 2015-11-19 MED ORDER — ATORVASTATIN CALCIUM 80 MG PO TABS: 40.0000 mg | ORAL_TABLET | Freq: Every day | ORAL | Status: AC

## 2015-11-19 MED ORDER — IOPAMIDOL (ISOVUE-370) INJECTION 76%
75.0000 mL | Freq: Once | INTRAVENOUS | Status: AC | PRN
  Administered 2015-11-19: 75 mL via INTRAVENOUS

## 2015-11-19 MED ORDER — CLOPIDOGREL BISULFATE 75 MG PO TABS: 75.0000 mg | ORAL_TABLET | Freq: Every day | ORAL | Status: DC

## 2015-11-19 MED ORDER — INSULIN GLARGINE 100 UNIT/ML ~~LOC~~ SOLN: SUBCUTANEOUS | Status: AC

## 2015-11-19 NOTE — Care Management Note (Signed)
Case Management Note  Patient Details  Name: Chase Flores MRN: [id number] Date of Birth: 07-Jan-1924  Subjective/Objective:     [name] will need to call his PCP Dr Larwance SachsBabaoff on Monday and request an appointment with outpatient physical therapy to be scheduled for him.                Action/Plan:   Expected Discharge Date:                  Expected Discharge Plan:     In-House Referral:     Discharge planning Services     Post Acute Care Choice:    Choice offered to:     DME Arranged:    DME Agency:     HH Arranged:    HH Agency:     Status of Service:     If discussed at [employer] Length of Stay Meetings, dates discussed:    Additional Comments:  Toddrick Sanna A, RN 11/19/2015, 11:57 AM

## 2015-11-19 NOTE — Evaluation (Addendum)
Physical Therapy Evaluation Patient Details Name: Chase Flores MRN: [id number] DOB: February 26, 1924 Today's Date: 11/19/2015   History of Present Illness  Chase Flores is an 82yo white male who comes to [facility] after acute insidious onset of incorherent speech. Upon arrival, head CT and MRI negative for acute abnormlity. Wife mentioned that pt BG was in 20's at time of episode. Hospitalist now considering this to be hypoglycemic in nature. Pt is being followed in OP for what appears to be a PD-like presentation of gait and postural changes though not to be true PD. He finished 12 visits of OP PT at [facility] in [place].  At baseline he AMB community distances c RW and intermittent use of RW inside home with some instances of instability and/or near falls.   Clinical Impression  The patient presents finishing up with a bath with NT. He demonstrates all functional mobility minGuard assistance level or less, but at baseline without acute impairment. Speech has returned to baseline. He has taken PT before [facility], and encouraged to FU with PCP regarding an additional OPPT referral PRN to evaluate any progressive decline in balance he may notice and to get training for new AD that better meet his IADL. Education given on best practices in reducing falls in home. No additional PT services needed at this location: will sign off.     Follow Up Recommendations Outpatient PT (evaluation of Single UE assistive device and training; balance training; postural education and HEP.)    Equipment Recommendations  None recommended by PT    Recommendations for Other Services       Precautions / Restrictions Precautions Precautions: Fall Restrictions Weight Bearing Restrictions: No      Mobility  Bed Mobility               General bed mobility comments: received sitting EOB.   Transfers Overall transfer level: Modified independent Equipment used: None                Ambulation/Gait Ambulation/Gait  assistance: Supervision;Min guard Ambulation Distance (Feet): 350 Feet Assistive device: Rolling walker (2 wheeled);None   Gait velocity: 0.86ms (comfortable pace)    General Gait Details: mostly abnormally fast cadence with intermittent episodes of decreased gait + increased step length that are brief. Strong rigid kyphosis noted with forward head posture.   Stairs            Wheelchair Mobility    Modified Rankin (Stroke Patients Only)       Balance Overall balance assessment: Modified Independent;No apparent balance deficits (not formally assessed);History of Falls                                           Pertinent Vitals/Pain      Home Living Family/patient expects to be discharged to:: Private residence Living Arrangements: Spouse/significant other                    Prior Function Level of Independence: Independent with assistive device(s)               Hand Dominance        Extremity/Trunk Assessment                         Communication   Communication: No difficulties  Cognition Arousal/Alertness: Awake/alert Behavior During Therapy: WFL for tasks assessed/performed Overall  Cognitive Status: Within Functional Limits for tasks assessed                      General Comments      Exercises        Assessment/Plan    PT Assessment All further PT needs can be met in the next venue of care  PT Diagnosis Generalized weakness;Abnormality of gait   PT Problem List Decreased strength;Decreased balance;Decreased range of motion  PT Treatment Interventions     PT Goals (Current goals can be found in the Care Plan section) Acute Rehab PT Goals Patient Stated Goal: remain active, and find a more applicable AD for his IADL PT Goal Formulation: All assessment and education complete, DC therapy    Frequency     Barriers to discharge        Co-evaluation               End of Session  Equipment Utilized During Treatment: Gait belt Activity Tolerance: Patient tolerated treatment well Patient left: in chair;with family/visitor present;with call bell/phone within reach Nurse Communication: Other (comment)         Time: 6962-9528 PT Time Calculation (min) (ACUTE ONLY): 20 min   Charges:   PT Evaluation $PT Eval Low Complexity: 1 Procedure PT Treatments $Therapeutic Activity: 8-22 mins   PT G Codes:        11:24 AM, 2015/11/25 Etta Grandchild, PT, DPT Physical Therapist - [place] [phone] (mobile)

## 2015-11-19 NOTE — Progress Notes (Signed)
Discharge instructions along with home medication list and follow up gone over with patient and wife, both verbalized that they understood instructions. Printed rx given to patient. Patient to be discharged home on ra. No distress noted. Will remove iv and telemetry.

## 2015-11-19 NOTE — Discharge Summary (Signed)
[facility] - [place] at [facility]   PATIENT NAME: Chase FerraraRoy Flores    MR#:  [id number]  DATE OF BIRTH:  02-07-1924  DATE OF ADMISSION:  11/17/2015 ADMITTING PHYSICIAN: Gery Prayebby Crosley, MD  DATE OF DISCHARGE: 11/19/2015  1:23 PM  PRIMARY CARE PHYSICIAN: BABAOFF, MARC E, MD    ADMISSION DIAGNOSIS:  Expressive aphasia [R47.01] Cerebral infarction due to unspecified mechanism [I63.9]  DISCHARGE DIAGNOSIS:  Active Problems:   Type 1 diabetes mellitus (HCC)   Hypercholesterolemia   Parkinsonian features   Slurred speech   HTN (hypertension), malignant   Carotid stenosis   SECONDARY DIAGNOSIS:   Past Medical History  Diagnosis Date  . Diabetes mellitus without complication (HCC)     type 1  . Enlarged prostate   . Parkinson disease (HCC)   . Frequency of urination     HOSPITAL COURSE:   1. Slurred speech. I will treat like a transient ischemic attack. MRI of the brain was negative for stroke. Patient's speech has improved. Patient on aspirin, Plavix and statin. The patient also had a hypoglycemic episode at home during the episode which could cause these symptoms too. 2. Type 1 diabetes with hypoglycemic episode at home. Hemoglobin A1c 7.7. I cut back on his Lantus to 5 units in the morning and 7 units in the afternoon. 3. Carotid stenosis. CT angios showing 70% stenosis on the right carotid artery. Follow-up with vascular surgery as outpatient. 4. BPH on finasteride 5. Parkinson's disease 6. Essential hypertension on lisinopril 7. 7 beat nonsustained V. tach with a normal echocardiogram. I replaced magnesium. Unclear if the patient was moving around at the time. 8. Bradycardia. Not on any rate controlling medication   DISCHARGE CONDITIONS:   Satisfactory  CONSULTS OBTAINED:  Treatment Team:  Kym GroomNeuro1 [facility], MD  DRUG ALLERGIES:  No Known Allergies  DISCHARGE MEDICATIONS:   Discharge Medication List as of 11/19/2015 10:48 AM    START taking these  medications   Details  clopidogrel (PLAVIX) 75 MG tablet Take 1 tablet (75 mg total) by mouth daily., Starting 11/19/2015, Until Discontinued, Print      CONTINUE these medications which have CHANGED   Details  atorvastatin (LIPITOR) 80 MG tablet Take 0.5 tablets (40 mg total) by mouth at bedtime., Starting 11/19/2015, Until Discontinued, No Print    insulin glargine (LANTUS) 100 UNIT/ML injection 5 units subcutaneous in the am; 7 units subcutaneous in the evening, No Print      CONTINUE these medications which have NOT CHANGED   Details  Ascorbic Acid (VITAMIN C WITH ROSE HIPS) 500 MG tablet Take 500 mg by mouth daily., Until Discontinued, Historical Med    aspirin EC 81 MG tablet Take 81 mg by mouth daily. , Until Discontinued, Historical Med    cholecalciferol (VITAMIN D) 1000 units tablet Take 1,000 Units by mouth daily., Until Discontinued, Historical Med    finasteride (PROSCAR) 5 MG tablet Take 5 mg by mouth daily. , Until Discontinued, Historical Med    fluticasone (FLONASE) 50 MCG/ACT nasal spray Place 2 sprays into both nostrils daily., Until Discontinued, Historical Med    folic acid (FOLVITE) 400 MCG tablet Take 400 mcg by mouth daily. , Until Discontinued, Historical Med    insulin lispro (HUMALOG) 100 UNIT/ML injection Inject 7 Units into the skin 3 (three) times daily before meals. Pt is to increase units based on BS readings., Until Discontinued, Historical Med    lisinopril (PRINIVIL,ZESTRIL) 10 MG tablet Take 10 mg by mouth daily. , Until  Discontinued, Historical Med    mirabegron ER (MYRBETRIQ) 50 MG TB24 tablet Take 50 mg by mouth daily. , Until Discontinued, Historical Med    sodium fluoride (DENTA 5000 PLUS) 1.1 % CREA dental cream Place 1 application onto teeth at bedtime., Until Discontinued, Historical Med    tamsulosin (FLOMAX) 0.4 MG CAPS capsule Take 0.8 mg by mouth daily after supper. , Until Discontinued, Historical Med      STOP taking these  medications     acarbose (PRECOSE) 50 MG tablet      amLODipine (NORVASC) 2.5 MG tablet          DISCHARGE INSTRUCTIONS:   Follow-up PMD one week  If you experience worsening of your admission symptoms, develop shortness of breath, life threatening emergency, suicidal or homicidal thoughts you must seek medical attention immediately by calling 911 or calling your MD immediately  if symptoms less severe.  You Must read complete instructions/literature along with all the possible adverse reactions/side effects for all the Medicines you take and that have been prescribed to you. Take any new Medicines after you have completely understood and accept all the possible adverse reactions/side effects.   Please note  You were cared for by a hospitalist during your hospital stay. If you have any questions about your discharge medications or the care you received while you were in the hospital after you are discharged, you can call the unit and asked to speak with the hospitalist on call if the hospitalist that took care of you is not available. Once you are discharged, your primary care physician will handle any further medical issues. Please note that NO REFILLS for any discharge medications will be authorized once you are discharged, as it is imperative that you return to your primary care physician (or establish a relationship with a primary care physician if you do not have one) for your aftercare needs so that they can reassess your need for medications and monitor your lab values.    Today   CHIEF COMPLAINT:   Chief Complaint  Patient presents with  . Code Stroke    HISTORY OF PRESENT ILLNESS:  Chase Flores  is a 82 y.o. male presented with slurred speech   VITAL SIGNS:  Blood pressure 139/62, pulse 64, temperature 97.9 F (36.6 C), temperature source Oral, resp. rate 18, height 5\' 8"  (1.727 m), weight 60.283 kg (132 lb 14.4 oz), SpO2 95 %.    PHYSICAL EXAMINATION:  GENERAL:  82  y.o.-year-old patient lying in the bed with no acute distress.  EYES: Pupils equal, round, reactive to light and accommodation. No scleral icterus. Extraocular muscles intact.  HEENT: Head atraumatic, normocephalic. Oropharynx and nasopharynx clear.  NECK:  Supple, no jugular venous distention. No thyroid enlargement, no tenderness.  LUNGS: Normal breath sounds bilaterally, no wheezing, rales,rhonchi or crepitation. No use of accessory muscles of respiration.  CARDIOVASCULAR: S1, S2 normal. No murmurs, rubs, or gallops.  ABDOMEN: Soft, non-tender, non-distended. Bowel sounds present. No organomegaly or mass.  EXTREMITIES: No pedal edema, cyanosis, or clubbing.  NEUROLOGIC: Cranial nerves II through XII are intact. Muscle strength 5/5 in all extremities. Sensation intact. Gait not checked.  PSYCHIATRIC: The patient is alert and oriented x 3.  SKIN: No obvious rash, lesion, or ulcer.   DATA REVIEW:   CBC  Recent Labs Lab 11/18/15 0612  WBC 7.6  HGB 11.4*  HCT 33.9*  PLT 149*    Chemistries   Recent Labs Lab 11/17/15 1957 11/18/15 0612  NA 138  136  K 4.8 4.3  CL 102 105  CO2 28 25  GLUCOSE 273* 402*  BUN 39* 33*  CREATININE 0.94 0.89  CALCIUM 8.8* 8.1*  AST 25  --   ALT 19  --   ALKPHOS 79  --   BILITOT 1.0  --     Cardiac Enzymes  Recent Labs Lab 11/17/15 1957  TROPONINI <0.03    Microbiology Results  No results found for this or any previous visit.  RADIOLOGY:  Ct Angio Head W/cm &/or Wo Cm  11/17/2015  CLINICAL DATA:  Confusion and difficulty speaking for 30 minutes. Symptoms started at approximately 1800 hours. Garbled speech. Follow-up code stroke. History of diabetes, hypertension. EXAM: CT ANGIOGRAPHY HEAD TECHNIQUE: Multidetector CT imaging of the head was performed using the standard protocol during bolus administration of intravenous contrast. Multiplanar CT image reconstructions and MIPs were obtained to evaluate the vascular anatomy. CONTRAST:  75 cc  Isovue 370 COMPARISON:  CT HEAD November 17, 2015 at 2000 hours. FINDINGS: ANTERIOR CIRCULATION: Patent cervical internal carotid arteries, petrous, cavernous and supra clinoid internal carotid arteries. Calcific atherosclerosis resulting in severe stenosis bilateral mid supraclinoid internal carotid arteries. Anterior and middle cerebral arteries are patent. Moderate to severe stenosis distal LEFT M1 segment. POSTERIOR CIRCULATION: RIGHT vertebral artery is dominant with patent vertebral arteries, vertebrobasilar junction and basilar artery, as well as main branch vessels. Severe stenosis LEFT mid V4 segment with luminal irregularity of the RIGHT V4 segment. Mild luminal regularity stenosis of the basilar artery. Tiny RIGHT and probably LEFT posterior communicating arteries. Patent posterior cerebral arteries with focal severe stenosis proximal RIGHT P3 segment. No large vessel occlusion, dissection, contrast extravasation or aneurysm within the anterior nor posterior circulation. No abnormal intracranial enhancement. IMPRESSION: No emergent large vessel occlusion. Severe stenoses bilateral supraclinoid internal carotid arteries. Additional high-grade stenoses of the anterior and posterior circulation, compatible with atherosclerosis. Electronically Signed   By: Awilda Metroourtnay  Bloomer M.D.   On: 11/17/2015 22:31   Ct Head Wo Contrast  11/17/2015  CLINICAL DATA:  Code stroke. Pt bib EMS w/ c/o confusion and difficulty speaking that started 30 minutes ago. Pt worse upon arrival to ED per EMS. Per EMS, pt able to stand and walk to truck. Wife sts that s/s start approx 1800. She sts that.*comment was truncated* EXAM: CT HEAD WITHOUT CONTRAST TECHNIQUE: Contiguous axial images were obtained from the base of the skull through the vertex without intravenous contrast. COMPARISON:  Brain MRI 04/15/2014 head CT 03/2018 15 FINDINGS: No acute intracranial hemorrhage. No focal mass lesion. No CT evidence of acute infarction. No  midline shift or mass effect. No hydrocephalus. Basilar cisterns are patent. Generalized cortical atrophy. There is volume loss in the cerebellum. Proportional ventricular dilatation Paranasal sinuses and mastoid air cells are clear. Orbits are clear. IMPRESSION: 1. No CT evidence of acute cortical infarction. 2. No intracranial hemorrhage. 3. Marked generalized cortical atrophy. Findings conveyed toERYKA GAYLE on 11/17/2015  at20:19. Electronically Signed   By: Genevive BiStewart  Edmunds M.D.   On: 11/17/2015 20:19   Ct Angio Neck W Or Wo Contrast  11/19/2015  CLINICAL DATA:  Carotid stenosis. Admitted with slurred speech and dizziness, with symptoms now improved. EXAM: CT ANGIOGRAPHY NECK TECHNIQUE: Multidetector CT imaging of the neck was performed using the standard protocol during bolus administration of intravenous contrast. Multiplanar CT image reconstructions and MIPs were obtained to evaluate the vascular anatomy. Carotid stenosis measurements (when applicable) are obtained utilizing NASCET criteria, using the distal internal carotid diameter as  the denominator. CONTRAST:  75 mL Isovue 370 COMPARISON:  Carotid Doppler ultrasound 11/18/2015. Head CTA 11/17/2015. FINDINGS: Aortic arch: Normal variant aortic arch branching pattern with common origin of the brachiocephalic and left common carotid arteries. Moderate calcified plaque in the arch. Widely patent brachiocephalic artery. Mild-to-moderate left greater than right subclavian artery plaque without significant stenosis. Right carotid system: Patent common carotid artery without significant stenosis. Prominent calcified plaque involving the carotid bifurcation and proximal ICA results in 70% ICA stenosis. The more distal cervical ICA is unremarkable. Left carotid system: Mild atherosclerotic plaque about the carotid bifurcation without stenosis. Vertebral arteries: Vertebral arteries are patent with the right being dominant. There is calcified plaque at the right  vertebral artery origin without significant stenosis. There is relatively diffuse irregularity of the left vertebral artery with multiple areas of mild narrowing involving the V1 and V2 segments. Skeleton: No acute osseous abnormality. Other neck: Mild scarring in the lung apices. A few small lung nodules are noted in the upper lobes measuring up to 3 mm in size. No neck mass. IMPRESSION: 1. 70% proximal right ICA stenosis. 2. No left-sided cervical carotid artery stenosis. 3. Multiple mild stenoses of the non dominant left vertebral artery. 4. Small biapical lung nodules measuring up to 3 mm. No specific follow-up recommended given patient's age. Electronically Signed   By: Sebastian Ache M.D.   On: 11/19/2015 10:13   Mr Brain Wo Contrast  11/18/2015  CLINICAL DATA:  Slurred speech yesterday, improved today. Some dizziness. EXAM: MRI HEAD WITHOUT CONTRAST TECHNIQUE: Multiplanar, multiecho pulse sequences of the brain and surrounding structures were obtained without intravenous contrast. COMPARISON:  Head CT/ CTA 11/17/2015 and MRI 04/15/2014 FINDINGS: There is no evidence of acute infarct, intracranial hemorrhage, mass, midline shift, or extra-axial fluid collection. Moderately advanced cerebral and cerebellar atrophy are unchanged from the prior MRI. Slightly asymmetric CSF overlying the right cerebral hemisphere is unchanged and felt to be due to atrophy rather than subdural hygroma. Small foci of T2 hyperintensity in the cerebral white matter and pons are unchanged from the prior MRI and nonspecific but compatible with mild chronic small vessel ischemic disease. Chronic bilateral cerebral white matter lacunar infarcts are unchanged. Prior bilateral cataract extraction is noted as well as left scleral banding. There is a trace right mastoid effusion. The paranasal sinuses are clear. Major intracranial vascular flow voids are preserved. IMPRESSION: 1. No acute intracranial abnormality. 2. Advanced cerebral  atrophy and mild chronic small vessel ischemic disease. Electronically Signed   By: Sebastian Ache M.D.   On: 11/18/2015 16:29   US Carotid Bilateral  11/18/2015  CLINICAL DATA:  Stroke. EXAM: BILATERAL CAROTID DUPLEX ULTRASOUND TECHNIQUE: Wallace Cullens scale imaging, color Doppler and duplex ultrasound were performed of bilateral carotid and vertebral arteries in the neck. COMPARISON:  None. FINDINGS: Criteria: Quantification of carotid stenosis is based on velocity parameters that correlate the residual internal carotid diameter with NASCET-based stenosis levels, using the diameter of the distal internal carotid lumen as the denominator for stenosis measurement. The following velocity measurements were obtained: RIGHT ICA:  275 cm/sec CCA:  82 cm/sec SYSTOLIC ICA/CCA RATIO:  3.3 DIASTOLIC ICA/CCA RATIO:  5.2 ECA:  187 cm/sec LEFT ICA:  123 cm/sec CCA:  101 cm/sec SYSTOLIC ICA/CCA RATIO:  1.2 DIASTOLIC ICA/CCA RATIO:  3.3 ECA:  175 cm/sec RIGHT CAROTID ARTERY: Calcified and heterogeneous plaque is seen within the right common carotid artery, at the right carotid bulb and within the proximal right ICA. Most significant stenosis is caused by  plaque within the proximal right ICA. RIGHT VERTEBRAL ARTERY:  Appropriate antegrade flow LEFT CAROTID ARTERY: Scattered calcified and heterogeneous plaque within the distal left common carotid artery, at the left carotid bulb and within the proximal left ICA, all of which is less prominent than on the right. LEFT VERTEBRAL ARTERY:  Appropriate antegrade flow. IMPRESSION: 1. Calcified and heterogeneous plaque within the proximal RIGHT ICA causing a high grade stenosis (greater than 70% stenosis) based on peak systolic velocity measurement of 275 centimeters/second. However, based on the ICA to CCA ratio of 3.3 and the ICA end-diastolic velocity of 30 centimeters/second, the stenosis may be slightly less than 70%. Qualitatively, the stenosis caused by the plaque in the proximal right ICA  appears significant. 2. No evidence of hemodynamically significant stenosis within the left common carotid or internal carotid arteries. 3. Normal anterograde flow within the bilateral vertebral arteries. Electronically Signed   By: Bary Elyza Whitt M.D.   On: 11/18/2015 11:39    Management plans discussed with the patient, family and they are in agreement.  CODE STATUS:  Code Status History    Date Active Date Inactive Code Status Order ID Comments User Context   11/18/2015  4:10 AM 11/19/2015  4:23 PM Full Code [id number]  Gery Pray, MD Inpatient    Advance Directive Documentation        Most Recent Value   Type of Advance Directive  Healthcare Power of Attorney, Living will   Pre-existing out of facility DNR order (yellow form or pink MOST form)     "MOST" Form in Place?        TOTAL TIME TAKING CARE OF THIS PATIENT: 35 minutes.    Alford Highland M.D on 11/19/2015 at 4:38 PM  Between 7am to 6pm - Pager - [phone]  After 6pm go to [email or web address] - password [email or web address]  [employer] [facility] Office  [phone]  CC: Primary care physician; BABAOFF, Lavada Mesi, MD

## 2015-11-29 ENCOUNTER — Encounter: Payer: Self-pay | Admitting: Podiatry

## 2015-11-29 ENCOUNTER — Ambulatory Visit (INDEPENDENT_AMBULATORY_CARE_PROVIDER_SITE_OTHER): Payer: Medicare Other | Admitting: Podiatry

## 2015-11-29 DIAGNOSIS — M79676 Pain in unspecified toe(s): Secondary | ICD-10-CM | POA: Diagnosis not present

## 2015-11-29 DIAGNOSIS — B351 Tinea unguium: Secondary | ICD-10-CM | POA: Diagnosis not present

## 2015-11-30 NOTE — Progress Notes (Signed)
[name] presents today as a diabetic with a chief complaint of painful elongated toenails.  Objective: Vital signs are stable he is alert and oriented 3. He is recovering from a stroke but is doing very well. He is walking with a walker. Pulses are palpable. His toenails are thick yellow dystrophic clinically mycotic no open lesions or wounds.  Assessment: Diabetes the limb secondary to onychomycosis  Plan: Debridement of toenails 1 through 5 bilateral. Follow up with him in 3 months.

## 2015-12-13 ENCOUNTER — Ambulatory Visit: Payer: Medicare Other | Admitting: Podiatry

## 2016-03-04 ENCOUNTER — Encounter: Payer: Self-pay | Admitting: Podiatry

## 2016-03-04 ENCOUNTER — Ambulatory Visit (INDEPENDENT_AMBULATORY_CARE_PROVIDER_SITE_OTHER): Payer: Medicare Other | Admitting: Podiatry

## 2016-03-04 DIAGNOSIS — B351 Tinea unguium: Secondary | ICD-10-CM

## 2016-03-04 DIAGNOSIS — M79676 Pain in unspecified toe(s): Secondary | ICD-10-CM | POA: Diagnosis not present

## 2016-03-05 NOTE — Progress Notes (Signed)
Chase Flores presents today with a chief complaint of painful elongated toenails. Is complaining of pain in his feet and toes.  Objective: Vital signs are stable he is alert and oriented 3. Toenails are thick yellow dystrophic onychomycotic. Pulses are palpable DP PT is nonpalpable edema hammertoe deformities are noted.  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  Plan: Debridement of all reactive hyperkeratosis debridement of toenails 1 through 5 bilateral. Also prepare paperwork for diabetic shoes.

## 2016-03-20 DIAGNOSIS — E1042 Type 1 diabetes mellitus with diabetic polyneuropathy: Secondary | ICD-10-CM | POA: Insufficient documentation

## 2016-03-21 ENCOUNTER — Telehealth: Payer: Self-pay | Admitting: Podiatry

## 2016-03-21 NOTE — Telephone Encounter (Signed)
Explained paperwork can take 4 to 6 weeks we will call when we have all the required documentation.  No there was not anything that they needed to do.

## 2016-05-29 ENCOUNTER — Ambulatory Visit (INDEPENDENT_AMBULATORY_CARE_PROVIDER_SITE_OTHER): Payer: Self-pay | Admitting: *Deleted

## 2016-05-29 DIAGNOSIS — M2041 Other hammer toe(s) (acquired), right foot: Secondary | ICD-10-CM

## 2016-05-29 DIAGNOSIS — E1142 Type 2 diabetes mellitus with diabetic polyneuropathy: Secondary | ICD-10-CM

## 2016-05-29 DIAGNOSIS — M2042 Other hammer toe(s) (acquired), left foot: Secondary | ICD-10-CM

## 2016-06-04 NOTE — Progress Notes (Signed)
Measured for diabetic shoes and insoles today. Will notify pt once they arrive. 

## 2016-06-10 ENCOUNTER — Other Ambulatory Visit (INDEPENDENT_AMBULATORY_CARE_PROVIDER_SITE_OTHER): Payer: Self-pay | Admitting: Vascular Surgery

## 2016-06-10 ENCOUNTER — Ambulatory Visit (INDEPENDENT_AMBULATORY_CARE_PROVIDER_SITE_OTHER): Payer: Medicare Other

## 2016-06-10 ENCOUNTER — Ambulatory Visit (INDEPENDENT_AMBULATORY_CARE_PROVIDER_SITE_OTHER): Payer: Medicare Other | Admitting: Vascular Surgery

## 2016-06-10 ENCOUNTER — Encounter (INDEPENDENT_AMBULATORY_CARE_PROVIDER_SITE_OTHER): Payer: Self-pay | Admitting: Vascular Surgery

## 2016-06-10 VITALS — BP 156/56 | HR 54 | Resp 16 | Wt 136.4 lb

## 2016-06-10 DIAGNOSIS — I10 Essential (primary) hypertension: Secondary | ICD-10-CM | POA: Diagnosis not present

## 2016-06-10 DIAGNOSIS — E7800 Pure hypercholesterolemia, unspecified: Secondary | ICD-10-CM | POA: Diagnosis not present

## 2016-06-10 DIAGNOSIS — I6529 Occlusion and stenosis of unspecified carotid artery: Secondary | ICD-10-CM

## 2016-06-10 DIAGNOSIS — E1059 Type 1 diabetes mellitus with other circulatory complications: Secondary | ICD-10-CM | POA: Diagnosis not present

## 2016-06-10 DIAGNOSIS — I6523 Occlusion and stenosis of bilateral carotid arteries: Secondary | ICD-10-CM

## 2016-06-10 DIAGNOSIS — R233 Spontaneous ecchymoses: Secondary | ICD-10-CM | POA: Insufficient documentation

## 2016-06-10 DIAGNOSIS — R238 Other skin changes: Secondary | ICD-10-CM

## 2016-06-10 NOTE — Progress Notes (Signed)
MRN : [id number]  Chase Flores is a 83 y.o. (February 12, 1924) male who presents with chief complaint of  Chief Complaint  Patient presents with  . Follow-up  .  History of Present Illness: The patient is seen for follow up evaluation of carotid stenosis. The carotid stenosis followed by ultrasound.   The patient denies amaurosis fugax. There is no recent history of TIA symptoms or focal motor deficits. There is no prior documented CVA.  The patient is taking enteric-coated aspirin 81 mg daily.  There is no history of migraine headaches. There is no history of seizures.  The patient has a history of coronary artery disease, no recent episodes of angina or shortness of breath. The patient denies PAD or claudication symptoms. There is a history of hyperlipidemia which is being treated with a statin.    Carotid Duplex done today shows RICA 40-59% and LICA <30%.  No change compared to last study in 11/2015 there is no change.  Current Meds  Medication Sig  . amLODipine (NORVASC) 2.5 MG tablet Take 2.5 mg by mouth.  . Ascorbic Acid (VITAMIN C WITH ROSE HIPS) 500 MG tablet Take 500 mg by mouth daily.  Marland Kitchen. aspirin EC 81 MG tablet Take 81 mg by mouth daily.   Marland Kitchen. atorvastatin (LIPITOR) 80 MG tablet Take 0.5 tablets (40 mg total) by mouth at bedtime.  . cholecalciferol (VITAMIN D) 1000 units tablet Take 1,000 Units by mouth daily.  . clopidogrel (PLAVIX) 75 MG tablet Take 1 tablet (75 mg total) by mouth daily.  . finasteride (PROSCAR) 5 MG tablet Take 5 mg by mouth daily.   . fluticasone (FLONASE) 50 MCG/ACT nasal spray Place 2 sprays into both nostrils daily.  . folic acid (FOLVITE) 400 MCG tablet Take 400 mcg by mouth daily.   . insulin glargine (LANTUS) 100 UNIT/ML injection 5 units subcutaneous in the am; 7 units subcutaneous in the evening  . insulin lispro (HUMALOG) 100 UNIT/ML injection Inject 7 Units into the skin 3 (three) times daily before meals. Pt is to increase units based on BS  readings.  Marland Kitchen. lisinopril-hydrochlorothiazide (PRINZIDE,ZESTORETIC) 20-12.5 MG tablet Take 1 tablet by mouth daily.  . mirabegron ER (MYRBETRIQ) 50 MG TB24 tablet Take 50 mg by mouth daily.   . sodium fluoride (DENTA 5000 PLUS) 1.1 % CREA dental cream Place 1 application onto teeth at bedtime.  . tamsulosin (FLOMAX) 0.4 MG CAPS capsule Take 0.8 mg by mouth daily after supper.     Past Medical History:  Diagnosis Date  . Diabetes mellitus without complication (HCC)    type 1  . Enlarged prostate   . Frequency of urination   . Parkinson disease (HCC)     No past surgical history on file.  Social History Social History  Substance Use Topics  . Smoking status: Never Smoker  . Smokeless tobacco: Not on file  . Alcohol use No    Family History No family history on file. No family history of bleeding/clotting disorders, porphyria or autoimmune disease   No Known Allergies   REVIEW OF SYSTEMS (Negative unless checked)  Constitutional: [] Weight loss  [] Fever  [] Chills Cardiac: [] Chest pain   [] Chest pressure   [] Palpitations   [] Shortness of breath when laying flat   [] Shortness of breath with exertion. Vascular:  [] Pain in legs with walking   [] Pain in legs at rest  [] History of DVT   [] Phlebitis   [] Swelling in legs   [] Varicose veins   [] Non-healing ulcers Pulmonary:   []   Uses home oxygen   [] Productive cough   [] Hemoptysis   [] Wheeze  [] COPD   [] Asthma Neurologic:  [] Dizziness   [] Seizures   [] History of stroke   [] History of TIA  [] Aphasia   [] Vissual changes   [] Weakness or numbness in arm   [] Weakness or numbness in leg Musculoskeletal:   [] Joint swelling   [] Joint pain   [] Low back pain Hematologic:  [] Easy bruising  [] Easy bleeding   [] Hypercoagulable state   [] Anemic Gastrointestinal:  [] Diarrhea   [] Vomiting  [] Gastroesophageal reflux/heartburn   [] Difficulty swallowing. Genitourinary:  [] Chronic kidney disease   [] Difficult urination  [] Frequent urination   [] Blood in  urine Skin:  [] Rashes   [] Ulcers  Psychological:  [] History of anxiety   []  History of major depression.  Physical Examination  Vitals:   06/10/16 1034  BP: (!) 156/56  Pulse: (!) 54  Resp: 16  Weight: 136 lb 6.4 oz (61.9 kg)   Body mass index is 20.74 kg/m. Gen: WD/WN, NAD Head: [place]/AT, No temporalis wasting.  Ear/Nose/Throat: Hearing grossly intact, nares w/o erythema or drainage, poor dentition Eyes: PER, EOMI, sclera nonicteric.  Neck: Supple, no masses.  No bruit or JVD.  Pulmonary:  Good air movement, clear to auscultation bilaterally, no use of accessory muscles.  Cardiac: RRR, normal S1, S2, no Murmurs. Vascular:  Right carotid bruit Vessel Right Left  Radial Palpable Palpable  Ulnar Palpable Palpable  Brachial Palpable Palpable  Carotid Palpable Palpable  Gastrointestinal: soft, non-distended. No guarding/no peritoneal signs.  Musculoskeletal: M/S 5/5 throughout.  No deformity or atrophy.  Neurologic: CN 2-12 intact. Pain and light touch intact in extremities.  Symmetrical.  Speech is fluent. Motor exam as listed above. Psychiatric: Judgment intact, Mood & affect appropriate for pt's clinical situation. Dermatologic: No rashes or ulcers noted.  No changes consistent with cellulitis. Lymph : No Cervical lymphadenopathy, no lichenification or skin changes of chronic lymphedema.  CBC Lab Results  Component Value Date   WBC 7.6 11/18/2015   HGB 11.4 (L) 11/18/2015   HCT 33.9 (L) 11/18/2015   MCV 97.6 11/18/2015   PLT 149 (L) 11/18/2015    BMET    Component Value Date/Time   NA 136 11/18/2015 0612   NA 143 04/15/2014 0355   K 4.3 11/18/2015 0612   K 3.8 04/15/2014 0355   CL 105 11/18/2015 0612   CL 108 (H) 04/15/2014 0355   CO2 25 11/18/2015 0612   CO2 28 04/15/2014 0355   GLUCOSE 402 (H) 11/18/2015 0612   GLUCOSE 86 04/15/2014 0355   BUN 33 (H) 11/18/2015 0612   BUN 24 (H) 04/15/2014 0355   CREATININE 0.89 11/18/2015 0612   CREATININE 0.81 04/15/2014  0355   CALCIUM 8.1 (L) 11/18/2015 0612   CALCIUM 8.0 (L) 04/15/2014 0355   GFRNONAA >60 11/18/2015 0612   GFRNONAA >60 04/15/2014 0355   GFRAA >60 11/18/2015 0612   GFRAA >60 04/15/2014 0355   CrCl cannot be calculated (Patient's most recent lab result is older than the maximum 21 days allowed.).  COAG Lab Results  Component Value Date   INR 0.98 11/17/2015   INR 0.9 04/14/2014    Radiology No results found.   Assessment/Plan 1. Bilateral carotid artery stenosis Recommend:  Given the patient's asymptomatic subcritical stenosis no further invasive testing or surgery at this time.  Duplex ultrasound shows RICA 40-59% and <30% stenosis bilaterally.  Continue antiplatelet therapy as prescribed Continue management of CAD, HTN and Hyperlipidemia Healthy heart diet,  encouraged exercise at least  4 times per week Follow up in 12 months with duplex ultrasound and physical exam based on the stable >50% stenosis of the  RICA carotid artery and the patient's age.  - VAS US CAROTID; Future  2. Essential (primary) hypertension Continue antihypertensive medications as already ordered, these medications have been reviewed and there are no changes at this time.  3. Type 1 diabetes mellitus with other circulatory complication (HCC) Continue hypoglycemic medications as already ordered, these medications have been reviewed and there are no changes at this time.  Hgb A1C to be monitored as already arranged by primary service  4. Hypercholesterolemia Continue statin as ordered and reviewed, no changes at this time  5. Abnormal bruising I will stop his Plavix and continue his ASA as there is no indication for duel therapy in a person of his age particularly with is worsening balance and increased bruising.    Levora Dredge, MD  06/10/2016 12:17 PM

## 2016-06-12 ENCOUNTER — Ambulatory Visit: Payer: Medicare Other | Admitting: Podiatry

## 2016-06-24 ENCOUNTER — Ambulatory Visit (INDEPENDENT_AMBULATORY_CARE_PROVIDER_SITE_OTHER): Payer: Medicare Other | Admitting: Podiatry

## 2016-06-24 ENCOUNTER — Encounter: Payer: Self-pay | Admitting: Podiatry

## 2016-06-24 DIAGNOSIS — B351 Tinea unguium: Secondary | ICD-10-CM

## 2016-06-24 DIAGNOSIS — E1142 Type 2 diabetes mellitus with diabetic polyneuropathy: Secondary | ICD-10-CM | POA: Diagnosis not present

## 2016-06-24 DIAGNOSIS — M79676 Pain in unspecified toe(s): Secondary | ICD-10-CM | POA: Diagnosis not present

## 2016-06-24 NOTE — Progress Notes (Signed)
He presented today chief complaint of painful elongated toenails and wanted us to evaluate him for his diabetic peripheral neuropathy.  Objective: Vital signs are stable he is alert and oriented 3. Pulses are palpable. Mild proximal hammertoe deformities. Reactive hyperkeratosis plantar aspect of left foot has resolved. No open lesions or wounds are noted. Toenails are thick yellow dystrophic with mycotic painful palpation as well as debridement.  Assessment: Diabetic peripheral neuropathy with pain in limb segment onychomycosis and hammertoe development.  Plan: Debridement of toenails 1 through 5 bilateral. Debridement of all reactive hyperkeratosis. An dispensed shoes today.   Dispensed diabetic shoes and 3 pairs of insoles. Instructions were reviewed and a copy was given to the patient. Patient to reappointment for regularly scheduled diabetic foot care visits or if he experiences any trouble with his diabetic shoes.

## 2016-06-24 NOTE — Patient Instructions (Signed)

## 2016-09-23 ENCOUNTER — Ambulatory Visit (INDEPENDENT_AMBULATORY_CARE_PROVIDER_SITE_OTHER): Payer: Medicare Other | Admitting: Podiatry

## 2016-09-23 ENCOUNTER — Encounter: Payer: Self-pay | Admitting: Podiatry

## 2016-09-23 DIAGNOSIS — E1142 Type 2 diabetes mellitus with diabetic polyneuropathy: Secondary | ICD-10-CM | POA: Diagnosis not present

## 2016-09-23 DIAGNOSIS — B351 Tinea unguium: Secondary | ICD-10-CM

## 2016-09-23 DIAGNOSIS — Q828 Other specified congenital malformations of skin: Secondary | ICD-10-CM

## 2016-09-23 DIAGNOSIS — M79676 Pain in unspecified toe(s): Secondary | ICD-10-CM

## 2016-09-23 NOTE — Progress Notes (Signed)
He presents today chief complaint of painful elongated toenails and calluses.  Objective: Pulses remain palpable neurologic services intact. Deep tendon reflexes are intact to nose along the talar dystrophic with mycotic multiple porokeratosis plantar aspect bilateral foot.  Assessment: Pain and limb cemented onychomycosis porokeratosis with diabetes.  Plan: Debridement of toenails 1 through 5 bilateral covered service secondary diabetes and poor keratoses.

## 2016-12-23 ENCOUNTER — Encounter: Payer: Self-pay | Admitting: Podiatry

## 2016-12-23 ENCOUNTER — Ambulatory Visit (INDEPENDENT_AMBULATORY_CARE_PROVIDER_SITE_OTHER): Payer: Medicare Other | Admitting: Podiatry

## 2016-12-23 DIAGNOSIS — B351 Tinea unguium: Secondary | ICD-10-CM

## 2016-12-23 DIAGNOSIS — E1042 Type 1 diabetes mellitus with diabetic polyneuropathy: Secondary | ICD-10-CM

## 2016-12-23 DIAGNOSIS — M79676 Pain in unspecified toe(s): Secondary | ICD-10-CM | POA: Diagnosis not present

## 2016-12-23 NOTE — Progress Notes (Signed)
Complaint:  Visit Type: Patient returns to my office for continued preventative foot care services. Complaint: Patient states" my nails have grown long and thick and become painful to walk and wear shoes" Patient has been diagnosed with DM with no foot complications. The patient presents for preventative foot care services. No changes to ROS  Podiatric Exam: Vascular: dorsalis pedis and posterior tibial pulses are palpable bilateral. Capillary return is immediate. Temperature gradient is WNL. Skin turgor WNL  Sensorium: Normal Semmes Weinstein monofilament test. Normal tactile sensation bilaterally. Nail Exam: Pt has thick disfigured discolored nails with subungual debris noted bilateral entire nail hallux through fifth toenails Ulcer Exam: There is no evidence of ulcer or pre-ulcerative changes or infection. Orthopedic Exam: Muscle tone and strength are WNL. No limitations in general ROM. No crepitus or effusions noted. Foot type and digits show no abnormalities. Bony prominences are unremarkable. Skin: No Porokeratosis. No infection or ulcers  Diagnosis:  Onychomycosis, , Pain in right toe, pain in left toes  Treatment & Plan Procedures and Treatment: Consent by patient was obtained for treatment procedures. The patient understood the discussion of treatment and procedures well. All questions were answered thoroughly reviewed. Debridement of mycotic and hypertrophic toenails, 1 through 5 bilateral and clearing of subungual debris. No ulceration, no infection noted.  Return Visit-Office Procedure: Patient instructed to return to the office for a follow up visit 3 months for continued evaluation and treatment.    Kavina Cantave DPM 

## 2017-03-24 ENCOUNTER — Ambulatory Visit (INDEPENDENT_AMBULATORY_CARE_PROVIDER_SITE_OTHER): Payer: Medicare Other | Admitting: Podiatry

## 2017-03-24 DIAGNOSIS — B351 Tinea unguium: Secondary | ICD-10-CM | POA: Diagnosis not present

## 2017-03-24 DIAGNOSIS — M79676 Pain in unspecified toe(s): Secondary | ICD-10-CM

## 2017-03-24 DIAGNOSIS — E1042 Type 1 diabetes mellitus with diabetic polyneuropathy: Secondary | ICD-10-CM

## 2017-03-24 NOTE — Progress Notes (Signed)
Complaint:  Visit Type: Patient returns to my office for continued preventative foot care services. Complaint: Patient states" my nails have grown long and thick and become painful to walk and wear shoes" Patient has been diagnosed with DM with no foot complications. The patient presents for preventative foot care services. No changes to ROS  Podiatric Exam: Vascular: dorsalis pedis and posterior tibial pulses are palpable bilateral. Capillary return is immediate. Temperature gradient is WNL. Skin turgor WNL  Sensorium: Normal Semmes Weinstein monofilament test. Normal tactile sensation bilaterally. Nail Exam: Pt has thick disfigured discolored nails with subungual debris noted bilateral entire nail hallux through fifth toenails Ulcer Exam: There is no evidence of ulcer or pre-ulcerative changes or infection. Orthopedic Exam: Muscle tone and strength are WNL. No limitations in general ROM. No crepitus or effusions noted. Foot type and digits show no abnormalities. Bony prominences are unremarkable. Skin: No Porokeratosis. No infection or ulcers  Diagnosis:  Onychomycosis, , Pain in right toe, pain in left toes  Treatment & Plan Procedures and Treatment: Consent by patient was obtained for treatment procedures. The patient understood the discussion of treatment and procedures well. All questions were answered thoroughly reviewed. Debridement of mycotic and hypertrophic toenails, 1 through 5 bilateral and clearing of subungual debris. No ulceration, no infection noted.  Return Visit-Office Procedure: Patient instructed to return to the office for a follow up visit 3 months for continued evaluation and treatment.    Kysen Wetherington DPM 

## 2017-04-25 ENCOUNTER — Telehealth (INDEPENDENT_AMBULATORY_CARE_PROVIDER_SITE_OTHER): Payer: Self-pay

## 2017-04-25 NOTE — Telephone Encounter (Signed)
Wife called to see if it is absolutely necessary for them to keep the 06/12/17 appointment because Dr. Gilda CreaseSchnier told her to call back later this year if he had any changes, and at this time he does not have any concerns.   So should they keep the appointment?

## 2017-04-25 NOTE — Telephone Encounter (Signed)
Called the patient back to let him know that he didn't have to keep the January appointment, and if anything changes health wise with his vision or brain function, that he should give us a call back to get seen.

## 2017-04-25 NOTE — Telephone Encounter (Signed)
Since the patient is asymptomatic he does not need to keep his January 2019 carotid stenosis follow-up.  Please just to review the possible symptoms with the patient.  Please make sure the patient's not experiencing any temporary blindness or neurological symptoms such as weakness on one side of the body versus the other.  If he should start experiencing any symptoms that should call our office.

## 2017-04-25 NOTE — Telephone Encounter (Signed)
Patient would like to cancel his January ultrasound and doctor visit, per Selena BattenKim, this is fine. Already okayed per patient to cancel.

## 2017-04-27 ENCOUNTER — Other Ambulatory Visit: Payer: Self-pay

## 2017-04-27 ENCOUNTER — Emergency Department
Admission: EM | Admit: 2017-04-27 | Discharge: 2017-04-27 | Disposition: A | Discharge status: Home / Self Care | Payer: Medicare Other | Attending: Emergency Medicine | Admitting: Emergency Medicine

## 2017-04-27 DIAGNOSIS — Z79899 Other long term (current) drug therapy: Secondary | ICD-10-CM | POA: Diagnosis not present

## 2017-04-27 DIAGNOSIS — G20 Parkinson's disease: Secondary | ICD-10-CM | POA: Diagnosis not present

## 2017-04-27 DIAGNOSIS — E1065 Type 1 diabetes mellitus with hyperglycemia: Secondary | ICD-10-CM | POA: Insufficient documentation

## 2017-04-27 DIAGNOSIS — Z7982 Long term (current) use of aspirin: Secondary | ICD-10-CM | POA: Diagnosis not present

## 2017-04-27 DIAGNOSIS — I10 Essential (primary) hypertension: Secondary | ICD-10-CM | POA: Insufficient documentation

## 2017-04-27 DIAGNOSIS — R739 Hyperglycemia, unspecified: Secondary | ICD-10-CM

## 2017-04-27 DIAGNOSIS — Z794 Long term (current) use of insulin: Secondary | ICD-10-CM | POA: Insufficient documentation

## 2017-04-27 LAB — URINALYSIS, COMPLETE (UACMP) WITH MICROSCOPIC
BACTERIA UA: NONE SEEN
BILIRUBIN URINE: NEGATIVE
Hgb urine dipstick: NEGATIVE
KETONES UR: NEGATIVE mg/dL
Leukocytes, UA: NEGATIVE
Nitrite: NEGATIVE
PH: 5 (ref 5.0–8.0)
PROTEIN: NEGATIVE mg/dL
Specific Gravity, Urine: 1.022 (ref 1.005–1.030)
Squamous Epithelial / LPF: NONE SEEN
WBC UA: NONE SEEN WBC/hpf (ref 0–5)

## 2017-04-27 LAB — CBC
HEMATOCRIT: 37.9 % — AB (ref 40.0–52.0)
HEMOGLOBIN: 12.5 g/dL — AB (ref 13.0–18.0)
MCH: 32.1 pg (ref 26.0–34.0)
MCHC: 33 g/dL (ref 32.0–36.0)
MCV: 97.2 fL (ref 80.0–100.0)
Platelets: 183 10*3/uL (ref 150–440)
RBC: 3.89 MIL/uL — AB (ref 4.40–5.90)
RDW: 13.3 % (ref 11.5–14.5)
WBC: 5.1 10*3/uL (ref 3.8–10.6)

## 2017-04-27 LAB — BASIC METABOLIC PANEL
ANION GAP: 9 (ref 5–15)
BUN: 48 mg/dL — ABNORMAL HIGH (ref 6–20)
CHLORIDE: 100 mmol/L — AB (ref 101–111)
CO2: 28 mmol/L (ref 22–32)
Calcium: 9.2 mg/dL (ref 8.9–10.3)
Creatinine, Ser: 1.23 mg/dL (ref 0.61–1.24)
GFR calc non Af Amer: 49 mL/min — ABNORMAL LOW (ref >60)
GFR, EST AFRICAN AMERICAN: 57 mL/min — AB (ref >60)
GLUCOSE: 324 mg/dL — AB (ref 65–99)
POTASSIUM: 4.4 mmol/L (ref 3.5–5.1)
Sodium: 137 mmol/L (ref 135–145)

## 2017-04-27 LAB — GLUCOSE, CAPILLARY
GLUCOSE-CAPILLARY: 307 mg/dL — AB (ref 65–99)
Glucose-Capillary: 348 mg/dL — ABNORMAL HIGH (ref 65–99)

## 2017-04-27 NOTE — ED Provider Notes (Signed)
[facility] Emergency Department Provider Note ____________________________________________   First MD Initiated Contact with Patient 04/27/17 2111     (approximate)  I have reviewed the triage vital signs and the nursing notes.   HISTORY  Chief Complaint Hyperglycemia    HPI Chase Flores is a 83 y.o. male with a history of diabetes who presents with hyperglycemia, gradual onset over the last few days, and noted by readings of high on his home blood glucose monitor.  Patient and his wife state that he recently got over a viral type illness with some GI symptoms, and he had a few high readings 2-3 days ago, but it was normal yesterday and earlier today.  Patient then had a few high readings and his doctor had told him to come to the hospital if they were persistently high.  Patient took an extra 7 units of Humalog, and also took his evening Lantus prior to coming to the hospital.  Patient states that has had frequent urination, but denies any other acute symptoms.  He states that the viral illness has resolved.   Past Medical History:  Diagnosis Date  . Diabetes mellitus without complication ([place])    type 1  . Enlarged prostate   . Frequency of urination   . Parkinson disease [facility])     Patient Active Problem List   Diagnosis Date Noted  . Abnormal bruising 06/10/2016  . DM type 1 with diabetic peripheral neuropathy ([place]) 03/20/2016  . Slurred speech 11/18/2015  . HTN (hypertension), malignant 11/18/2015  . Carotid stenosis 11/18/2015  . Controlled diabetes mellitus type 1 without complications ([place]) 16/02/9603  . Pure hypercholesterolemia 05/16/2014  . Overactive bladder 04/25/2014  . Benign non-nodular prostatic hyperplasia 03/01/2014  . Benign essential hypertension 03/01/2014  . Multifactorial gait disorder 02/06/2013  . Parkinsonian features 02/03/2013  . Anemia 11/12/2012  . Frequency of urination 11/12/2012  . Hypoglycemia due to insulin  11/12/2012  . Polyneuropathy in diabetes ([place]) 04/08/2011  . Abnormality of gait 02/26/2011  . Essential hypertension 11/25/1996  . Hypercholesterolemia 11/25/1996  . Contracture of palmar fascia 05/27/1899  . Ptosis of eyelid 05/27/1899    No past surgical history on file.  Prior to Admission medications   Medication Sig Start Date End Date Taking? Authorizing Provider  acarbose (PRECOSE) 25 MG tablet TK 1 T PO  NIGHTLY 12/03/16   [provider]  acarbose (PRECOSE) 50 MG tablet 1/2 to 1 tablet at bedtime 06/15/15   [provider]  amLODipine (NORVASC) 2.5 MG tablet Take 2.5 mg by mouth. 06/15/15   [provider]  amLODipine (NORVASC) 2.5 MG tablet Take by mouth. 10/10/16   [provider]  Ascorbic Acid (VITAMIN C WITH ROSE HIPS) 500 MG tablet Take 500 mg by mouth daily.    [provider]  Ascorbic Acid (VITAMIN C) 1000 MG tablet Take by mouth.    [provider]  aspirin EC 81 MG tablet Take 81 mg by mouth daily.     [provider]  aspirin EC 81 MG tablet Take by mouth.    [provider]  atorvastatin (LIPITOR) 40 MG tablet Take by mouth. 10/10/16   [provider]  atorvastatin (LIPITOR) 80 MG tablet Take 0.5 tablets (40 mg total) by mouth at bedtime. 11/19/15   Loletha Grayer, MD  atorvastatin (LIPITOR) 80 MG tablet 1/2 to 1 tablet a day as tolerated 06/15/15   [provider]  B-D INS SYR ULTRAFINE 1CC/31G 31G X  5/16" 1 ML MISC  01/10/17   [provider]  BD INSULIN SYRINGE ULTRAFINE 31G X 15/64" 0.3 ML MISC  11/05/16   [provider]  BD VEO INSULIN SYR ULTRAFINE 31G X 15/64" 1 ML MISC 4 (four) times daily. as directed 02/27/17   [provider]  blood glucose meter kit and supplies Check every bottle of strips 11/17/15   [provider]  cholecalciferol (VITAMIN D) 1000 units tablet Take 1,000 Units by mouth daily.    [provider]    Cholecalciferol (VITAMIN D-1000 MAX ST) 1000 units tablet Take by mouth.    [provider]  finasteride (PROSCAR) 5 MG tablet Take 5 mg by mouth daily.     [provider]  finasteride (PROSCAR) 5 MG tablet Take 5 mg by mouth. 06/15/15   [provider]  fluticasone (FLONASE) 50 MCG/ACT nasal spray Place 2 sprays into both nostrils daily.    [provider]  fluticasone (FLONASE) 50 MCG/ACT nasal spray 2 sprays by Each Nare route daily at 0600. 03/22/15   [provider]  folic acid (FOLVITE) 1 MG tablet Take by mouth.    [provider]  folic acid (FOLVITE) 789 MCG tablet Take 400 mcg by mouth daily.     [provider]  glucose blood (FREESTYLE LITE) test strip Use 6 (six) times daily. FREE STYLE LITE TEST STRIPS e10.9 11/05/16 11/05/17  [provider]  insulin glargine (LANTUS) 100 UNIT/ML injection 5 units subcutaneous in the am; 7 units subcutaneous in the evening 11/19/15   Wieting, Richard, MD  insulin glargine (LANTUS) 100 UNIT/ML injection INJECT 5 UNITS EVERY MORNING AND 8 UNITS EVERY EVENING 11/05/16   [provider]  insulin glargine (LANTUS) 100 UNIT/ML injection Take 7 units of Lantus in the morning and 7 in the evening. Discard bottle after 28 days. 07/03/16   [provider]  insulin lispro (HUMALOG) 100 UNIT/ML injection Inject 7 Units into the skin 3 (three) times daily before meals. Pt is to increase units based on BS readings.    [provider]  insulin lispro (HUMALOG) 100 UNIT/ML injection Take 8_0 , 6_1 , 7_2 . Reduce 1 for sugar less than 150 & increase by 1 per 50 when sugar is over 200. Discard after 28 days 07/03/16   [provider]  Insulin Syringe-Needle U-100 (INSULIN SYRINGE 1CC/30GX5/16") 30G X 5/16" 1 ML MISC Use 1 each 5 (five) times daily. E10.9 11/05/16   [provider]  Lancets 28G MISC Use 1 each 4 (four) times daily. Use as instructed.  05/08/16 05/08/17  [provider]  lisinopril-hydrochlorothiazide (PRINZIDE,ZESTORETIC) 20-12.5 MG tablet Take 1 tablet by mouth daily. 03/27/16   [provider]  lisinopril-hydrochlorothiazide Reita May) 20-12.5 MG tablet Take by mouth. 06/20/16 06/20/17  [provider]  mirabegron ER (MYRBETRIQ) 50 MG TB24 tablet Take 50 mg by mouth daily.     [provider]  sodium fluoride (DENTA 5000 PLUS) 1.1 % CREA dental cream Place 1 application onto teeth at bedtime.    [provider]  sodium fluoride (DENTA 5000 PLUS) 1.1 % CREA dental cream  08/19/14   [provider]  tamsulosin (FLOMAX) 0.4 MG CAPS capsule Take 0.8 mg by mouth daily after supper.     [provider]  tamsulosin (FLOMAX) 0.4 MG CAPS capsule Take 0.8 mg by mouth. 06/15/15   [provider]    Allergies Patient has no known allergies.  No family history on file.  Social History Social History   Tobacco Use  . Smoking status: Never Smoker  . Smokeless tobacco: Never Used  Substance Use Topics  . Alcohol use: No  . Drug use: Not on file    Review of Systems  Constitutional: No fever/chills. Eyes: No redness. ENT: No sore throat. Cardiovascular: Denies chest pain. Respiratory: Denies shortness of breath. Gastrointestinal: No nausea, no vomiting.  No diarrhea.  Genitourinary: Positive for polyuria.  Musculoskeletal: Negative for back pain. Skin: Negative for rash. Neurological: Negative for headaches, focal weakness or numbness.   ____________________________________________   PHYSICAL EXAM:  VITAL SIGNS: ED Triage Vitals [04/27/17 1906]  Enc Vitals Group     BP (!) 181/62     Pulse Rate 60     Resp 18     Temp 98.4 F (36.9 C)     Temp Source Oral     SpO2 98 %     Weight 139 lb (63 kg)     Height _0  (1.753 m)     Head Circumference      Peak Flow      Pain Score      Pain Loc      Pain Edu?      Excl. in [place]?      Constitutional: Alert and oriented. Well appearing for age and in no acute distress. Eyes: Conjunctivae are normal.  Head: Atraumatic. Nose: No congestion/rhinnorhea. Mouth/Throat: Mucous membranes are moist.   Neck: Normal range of motion.  Cardiovascular: Normal rate, regular rhythm. Grossly normal heart sounds.  Good peripheral circulation. Respiratory: Normal respiratory effort.  No retractions. Lungs CTAB. Gastrointestinal: Soft and nontender. No distention.  Genitourinary: No CVA tenderness. Musculoskeletal: No lower extremity edema.  Extremities warm and well perfused.  Neurologic:  Normal speech and language. No gross focal neurologic deficits are appreciated.  Motor and sensory intact in all extremities. Skin:  Skin is warm and dry. No rash noted. Psychiatric: Mood and affect are normal. Speech and behavior are normal.  ____________________________________________   LABS (all labs ordered are listed, but only abnormal results are displayed)  Labs Reviewed  BASIC METABOLIC PANEL - Abnormal; Notable for the following components:      Result Value   Chloride 100 (*)    Glucose, Bld 324 (*)    BUN 48 (*)    GFR calc non Af Amer 49 (*)    GFR calc Af Amer 57 (*)    All other components within normal limits  CBC - Abnormal; Notable for the following components:   RBC 3.89 (*)    Hemoglobin 12.5 (*)    HCT 37.9 (*)    All other components within normal limits  URINALYSIS, COMPLETE (UACMP) WITH MICROSCOPIC - Abnormal; Notable for the following components:   Color, Urine YELLOW (*)    APPearance CLEAR (*)    Glucose, UA >=500 (*)    All other components within normal limits  GLUCOSE, CAPILLARY - Abnormal; Notable for the following components:   Glucose-Capillary 348 (*)    All other components within normal limits  GLUCOSE, CAPILLARY - Abnormal; Notable for the following components:   Glucose-Capillary 307 (*)    All other components within normal limits  CBG  MONITORING, ED  CBG MONITORING, ED   ____________________________________________  EKG   ____________________________________________  RADIOLOGY    ____________________________________________   PROCEDURES  Procedure(s) performed: No    Critical Care performed: No ____________________________________________   INITIAL IMPRESSION / ASSESSMENT AND PLAN / ED COURSE  Pertinent labs & imaging results that were available during my care of the patient were reviewed by me and considered in my medical decision making (see chart for details).  83 year old male with history of insulin-dependent diabetes presents with hyperglycemia characterized by high blood glucose readings (which he states indicates greater than 450 on his meter) today.  Patient reports some polyuria, but is otherwise asymptomatic.  He took an extra dose of his short acting insulin, took his evening Lantus prior to coming hospital.  Reviewed past medical records in [place] and they are noncontributory.  On exam, patient is well-appearing for age, vitals are normal except for hypertension, and the remainder the exam is unremarkable.  Initial labs done in triage show blood glucose in the 300s, normal anion gap, no significant electrolyte abnormalities, and no ketones in the urine.  There is no clinical evidence for DKA.  Since it has been 2 hours since this blood was drawn, we will repeat a fingerstick now.  If persistently elevated I will likely give additional insulin.  If in the low 300s or lower, I will discharge home.  Patient has follow-up with his endocrinologist scheduled for tomorrow afternoon.     ----------------------------------------- 9:59 PM on 04/27/2017 -----------------------------------------  Repeat fingerstick is 307.  At this time, given the patient has been prone to hypoglycemia in the past and his glucose can be somewhat brittle per the wife, I do not think it is indicated to give additional  insulin in the ED.  Patient and his wife agree with me that they would prefer that his sugar stay a little bit higher than normal rather than drop too low.  Given that there is no evidence of DKA or other concerning lab abnormalities, he is safe for discharge home.  They will take the normal dose of insulin, and follow-up with endocrinologist tomorrow 1 PM.  I gave the return precautions, and the patient and his wife expressed understanding.  I answered all of their questions.  ____________________________________________   FINAL CLINICAL IMPRESSION(S) / ED DIAGNOSES  Final diagnoses:  Hyperglycemia      NEW MEDICATIONS STARTED DURING THIS VISIT:  This SmartLink is deprecated. Use AVSMEDLIST instead to display the medication list for a patient.   Note:  This document was prepared using Dragon voice recognition software and may include unintentional dictation errors.    Arta Silence, MD 04/27/17 2201

## 2017-04-27 NOTE — ED Triage Notes (Addendum)
Pt states that earlier this week he had a stomach upset, reports high blood sugar Friday that eventually came down to 359, pt then checked sugars today and got high readings each time. Pt states that sometimes he feels sick other times he doesn't pt refused wheelchair and is ambulating with his rolling walker, wife is with him. At 1500 he took 7 units of humalog and at 1800 he took 8 units of lantus and 6 units of humalog, each time he took these doses his meter registered high.

## 2017-04-27 NOTE — Discharge Instructions (Signed)
Follow-up with your endocrinologist tomorrow.  You should take your normal morning dose of insulin.  Return to the ER for persistently high glucose readings, weakness or lightheadedness, fevers, or any other new or worsening symptoms that concern you.

## 2017-04-27 NOTE — ED Notes (Signed)
Pt discharged to home.  Family member driving.  Discharge instructions reviewed.  Verbalized understanding.  No questions or concerns at this time.  Teach back verified.  Pt in NAD.  No items left in ED.   

## 2017-06-12 ENCOUNTER — Ambulatory Visit (INDEPENDENT_AMBULATORY_CARE_PROVIDER_SITE_OTHER): Payer: Medicare Other | Admitting: Vascular Surgery

## 2017-06-12 ENCOUNTER — Encounter (INDEPENDENT_AMBULATORY_CARE_PROVIDER_SITE_OTHER): Payer: Medicare Other

## 2017-06-30 ENCOUNTER — Ambulatory Visit (INDEPENDENT_AMBULATORY_CARE_PROVIDER_SITE_OTHER): Payer: Medicare Other | Admitting: Podiatry

## 2017-06-30 ENCOUNTER — Encounter: Payer: Self-pay | Admitting: Podiatry

## 2017-06-30 DIAGNOSIS — E1042 Type 1 diabetes mellitus with diabetic polyneuropathy: Secondary | ICD-10-CM | POA: Diagnosis not present

## 2017-06-30 DIAGNOSIS — M79676 Pain in unspecified toe(s): Secondary | ICD-10-CM

## 2017-06-30 DIAGNOSIS — B351 Tinea unguium: Secondary | ICD-10-CM

## 2017-06-30 NOTE — Progress Notes (Signed)
Complaint:  Visit Type: Patient returns to my office for continued preventative foot care services. Complaint: Patient states" my nails have grown long and thick and become painful to walk and wear shoes" Patient has been diagnosed with DM with no foot complications. The patient presents for preventative foot care services. No changes to ROS.  Healing skin lesion due to walker third toe left foot.  Podiatric Exam: Vascular: dorsalis pedis and posterior tibial pulses are palpable bilateral. Capillary return is immediate. Temperature gradient is WNL. Skin turgor WNL  Sensorium: Normal Semmes Weinstein monofilament test. Normal tactile sensation bilaterally. Nail Exam: Pt has thick disfigured discolored nails with subungual debris noted bilateral entire nail hallux through fifth toenails Ulcer Exam: There is no evidence of ulcer or pre-ulcerative changes or infection. Orthopedic Exam: Muscle tone and strength are WNL. No limitations in general ROM. No crepitus or effusions noted. Foot type and digits show no abnormalities. Bony prominences are unremarkable. Skin: No Porokeratosis. No infection or ulcers  Diagnosis:  Onychomycosis, , Pain in right toe, pain in left toes  Treatment & Plan Procedures and Treatment: Consent by patient was obtained for treatment procedures. The patient understood the discussion of treatment and procedures well. All questions were answered thoroughly reviewed. Debridement of mycotic and hypertrophic toenails, 1 through 5 bilateral and clearing of subungual debris. No ulceration, no infection noted. Skin lesion bandaged with neosporin/DSD.  ABN signed for 2019 Return Visit-Office Procedure: Patient instructed to return to the office for a follow up visit 3 months for continued evaluation and treatment.    Helane GuntherGregory Kaelum Kissick DPM

## 2017-08-12 ENCOUNTER — Emergency Department: Payer: Medicare Other

## 2017-08-12 ENCOUNTER — Other Ambulatory Visit: Payer: Self-pay

## 2017-08-12 ENCOUNTER — Encounter: Payer: Self-pay | Admitting: Emergency Medicine

## 2017-08-12 ENCOUNTER — Emergency Department
Admission: EM | Admit: 2017-08-12 | Discharge: 2017-08-12 | Disposition: A | Discharge status: Other Acute Inpatient Hospital | Payer: Medicare Other | Attending: Student in an Organized Health Care Education/Training Program | Admitting: Student in an Organized Health Care Education/Training Program

## 2017-08-12 DIAGNOSIS — I6201 Nontraumatic acute subdural hemorrhage: Secondary | ICD-10-CM | POA: Insufficient documentation

## 2017-08-12 DIAGNOSIS — Z79899 Other long term (current) drug therapy: Secondary | ICD-10-CM | POA: Insufficient documentation

## 2017-08-12 DIAGNOSIS — Z794 Long term (current) use of insulin: Secondary | ICD-10-CM | POA: Insufficient documentation

## 2017-08-12 DIAGNOSIS — I10 Essential (primary) hypertension: Secondary | ICD-10-CM | POA: Diagnosis not present

## 2017-08-12 DIAGNOSIS — S065X9A Traumatic subdural hemorrhage with loss of consciousness of unspecified duration, initial encounter: Secondary | ICD-10-CM

## 2017-08-12 DIAGNOSIS — E1165 Type 2 diabetes mellitus with hyperglycemia: Secondary | ICD-10-CM | POA: Diagnosis not present

## 2017-08-12 DIAGNOSIS — S065XAA Traumatic subdural hemorrhage with loss of consciousness status unknown, initial encounter: Secondary | ICD-10-CM

## 2017-08-12 DIAGNOSIS — Z7982 Long term (current) use of aspirin: Secondary | ICD-10-CM | POA: Insufficient documentation

## 2017-08-12 DIAGNOSIS — R4182 Altered mental status, unspecified: Secondary | ICD-10-CM | POA: Diagnosis present

## 2017-08-12 DIAGNOSIS — R739 Hyperglycemia, unspecified: Secondary | ICD-10-CM

## 2017-08-12 LAB — BASIC METABOLIC PANEL
Anion gap: 11 (ref 5–15)
BUN: 51 mg/dL — ABNORMAL HIGH (ref 6–20)
CALCIUM: 8.8 mg/dL — AB (ref 8.9–10.3)
CHLORIDE: 101 mmol/L (ref 101–111)
CO2: 23 mmol/L (ref 22–32)
Creatinine, Ser: 1.28 mg/dL — ABNORMAL HIGH (ref 0.61–1.24)
GFR calc Af Amer: 55 mL/min — ABNORMAL LOW (ref >60)
GFR calc non Af Amer: 47 mL/min — ABNORMAL LOW (ref >60)
GLUCOSE: 576 mg/dL — AB (ref 65–99)
Potassium: 4.7 mmol/L (ref 3.5–5.1)
Sodium: 135 mmol/L (ref 135–145)

## 2017-08-12 LAB — CBC
HEMATOCRIT: 37.5 % — AB (ref 40.0–52.0)
Hemoglobin: 12.3 g/dL — ABNORMAL LOW (ref 13.0–18.0)
MCH: 32.8 pg (ref 26.0–34.0)
MCHC: 32.7 g/dL (ref 32.0–36.0)
MCV: 100.5 fL — AB (ref 80.0–100.0)
Platelets: 199 10*3/uL (ref 150–440)
RBC: 3.73 MIL/uL — ABNORMAL LOW (ref 4.40–5.90)
RDW: 13.7 % (ref 11.5–14.5)
WBC: 6 10*3/uL (ref 3.8–10.6)

## 2017-08-12 LAB — GLUCOSE, CAPILLARY
GLUCOSE-CAPILLARY: 439 mg/dL — AB (ref 65–99)
Glucose-Capillary: 504 mg/dL (ref 65–99)
Glucose-Capillary: 508 mg/dL (ref 65–99)

## 2017-08-12 LAB — PROTIME-INR
INR: 1.03
Prothrombin Time: 13.4 seconds (ref 11.4–15.2)

## 2017-08-12 MED ORDER — LABETALOL HCL 5 MG/ML IV SOLN
5.0000 mg | Freq: Once | INTRAVENOUS | Status: AC
  Administered 2017-08-12: 5 mg via INTRAVENOUS
  Filled 2017-08-12: qty 4

## 2017-08-12 MED ORDER — SODIUM CHLORIDE 0.9 % IV BOLUS (SEPSIS)
1000.0000 mL | Freq: Once | INTRAVENOUS | Status: AC
  Administered 2017-08-12: 1000 mL via INTRAVENOUS

## 2017-08-12 MED ORDER — SODIUM CHLORIDE 0.9 % IV SOLN
INTRAVENOUS | Status: AC
  Administered 2017-08-12: 15:00:00 via INTRAVENOUS
  Filled 2017-08-12: qty 10

## 2017-08-12 MED ORDER — LEVETIRACETAM IN NACL 1000 MG/100ML IV SOLN: 1000.0000 mg | Freq: Once | INTRAVENOUS | Status: DC

## 2017-08-12 NOTE — ED Notes (Signed)
CBG 508 

## 2017-08-12 NOTE — ED Triage Notes (Signed)
Pt to ED via POV for c/o high blood sugar. Pt wife states that his meter is reading "high". Pt was told by his PCP to come to the ED to be evaluated. Pt in NAD at this time.

## 2017-08-12 NOTE — ED Notes (Signed)
Pt family called out , pt noted to the have right facial droop and right leg weakness, pt was not giving any verbal response. Chase Flores that was with pt stated he noticed the facial droop about 45min prior, then when they called out the pt began loosing control of his right leg. On arrival pt was alert and verbal with no noted facial droop or right sided weakness. Charge nurse notified and code stroke activated, pt transported to CT and then to room 18 for evaluation.

## 2017-08-12 NOTE — ED Notes (Signed)
CBG 504, Dr. Roxan Hockeyobinson informed and verbal order given for 1 liter NS bolus. Pt denies hx/o CHF.

## 2017-08-12 NOTE — ED Notes (Signed)
Wheeled pt to bathroom. Used urinal. Pt twisting while urinating. Urine down pant leg from twisting. Family concerned about R sided stiffness and weakness. First note notified.

## 2017-08-12 NOTE — ED Provider Notes (Addendum)
[facility] Emergency Department Provider Note    None    (approximate)  I have reviewed the triage vital signs and the nursing notes.   HISTORY  Chief Complaint Hyperglycemia  Level V Caveat: Critical illness and altered mental status secondary to subdural hematoma  HPI Chase Flores is a 84 y.o. male presents to the ER for evaluation of waxing and waning hyperglycemia over the past several days.  Family also states the patient has had 3 falls over the past week normal witnessed and they did not seek medical care.    Past Medical History:  Diagnosis Date  . Diabetes mellitus without complication ([place])    type 1  . Enlarged prostate   . Frequency of urination   . Parkinson disease ([place])    No family history on file. History reviewed. No pertinent surgical history. Patient Active Problem List   Diagnosis Date Noted  . Abnormal bruising 06/10/2016  . DM type 1 with diabetic peripheral neuropathy ([place]) 03/20/2016  . Slurred speech 11/18/2015  . HTN (hypertension), malignant 11/18/2015  . Carotid stenosis 11/18/2015  . Controlled diabetes mellitus type 1 without complications ([place]) 16/02/9603  . Pure hypercholesterolemia 05/16/2014  . Overactive bladder 04/25/2014  . Benign non-nodular prostatic hyperplasia 03/01/2014  . Benign essential hypertension 03/01/2014  . Multifactorial gait disorder 02/06/2013  . Parkinsonian features 02/03/2013  . Anemia 11/12/2012  . Frequency of urination 11/12/2012  . Hypoglycemia due to insulin 11/12/2012  . Polyneuropathy in diabetes ([place]) 04/08/2011  . Abnormality of gait 02/26/2011  . Essential hypertension 11/25/1996  . Hypercholesterolemia 11/25/1996  . Contracture of palmar fascia 05/27/1899  . Ptosis of eyelid 05/27/1899      Prior to Admission medications   Medication Sig Start Date End Date Taking? Authorizing Provider  acarbose (PRECOSE) 25 MG tablet TK 1 T PO  NIGHTLY 12/03/16   [provider]  acarbose (PRECOSE) 50 MG tablet 1/2 to 1 tablet at bedtime 06/15/15   [provider]  amLODipine (NORVASC) 2.5 MG tablet Take 2.5 mg by mouth. 06/15/15   [provider]  amLODipine (NORVASC) 2.5 MG tablet Take by mouth. 10/10/16   [provider]  Ascorbic Acid (VITAMIN C WITH ROSE HIPS) 500 MG tablet Take 500 mg by mouth daily.    [provider]  Ascorbic Acid (VITAMIN C) 1000 MG tablet Take by mouth.    [provider]  aspirin EC 81 MG tablet Take 81 mg by mouth daily.     [provider]  aspirin EC 81 MG tablet Take by mouth.    [provider]  atorvastatin (LIPITOR) 40 MG tablet Take by mouth. 10/10/16   [provider]  atorvastatin (LIPITOR) 80 MG tablet Take 0.5 tablets (40 mg total) by mouth at bedtime. 11/19/15   Loletha Grayer, MD  atorvastatin (LIPITOR) 80 MG tablet 1/2 to 1 tablet a day as tolerated 06/15/15   [provider]  B-D INS SYR ULTRAFINE 1CC/31G 31G X 5/16" 1 ML MISC  01/10/17   [provider]  BD INSULIN SYRINGE ULTRAFINE 31G X 15/64" 0.3 ML MISC  11/05/16   [provider]  BD VEO INSULIN SYR ULTRAFINE 31G X 15/64" 1 ML MISC 4 (four) times daily. as directed 02/27/17   [provider]  blood glucose meter kit and supplies Check every bottle of strips 11/17/15   [provider]  cholecalciferol (VITAMIN D) 1000 units tablet Take 1,000 Units by mouth daily.  [provider]  Cholecalciferol (VITAMIN D-1000 MAX ST) 1000 units tablet Take by mouth.    [provider]  finasteride (PROSCAR) 5 MG tablet Take 5 mg by mouth daily.     [provider]  finasteride (PROSCAR) 5 MG tablet Take 5 mg by mouth. 06/15/15   [provider]  fluticasone (FLONASE) 50 MCG/ACT nasal spray Place 2 sprays into both nostrils daily.    [provider]  fluticasone (FLONASE) 50 MCG/ACT nasal spray 2 sprays by Each Nare  route daily at 0600. 03/22/15   [provider]  folic acid (FOLVITE) 1 MG tablet Take by mouth.    [provider]  folic acid (FOLVITE) 488 MCG tablet Take 400 mcg by mouth daily.     [provider]  glucose blood (FREESTYLE LITE) test strip Use 6 (six) times daily. FREE STYLE LITE TEST STRIPS e10.9 11/05/16 11/05/17  [provider]  insulin glargine (LANTUS) 100 UNIT/ML injection 5 units subcutaneous in the am; 7 units subcutaneous in the evening 11/19/15   Wieting, Richard, MD  insulin glargine (LANTUS) 100 UNIT/ML injection INJECT 5 UNITS EVERY MORNING AND 8 UNITS EVERY EVENING 11/05/16   [provider]  insulin glargine (LANTUS) 100 UNIT/ML injection Take 7 units of Lantus in the morning and 7 in the evening. Discard bottle after 28 days. 07/03/16   [provider]  insulin lispro (HUMALOG) 100 UNIT/ML injection Inject 7 Units into the skin 3 (three) times daily before meals. Pt is to increase units based on BS readings.    [provider]  insulin lispro (HUMALOG) 100 UNIT/ML injection Take 8@breakfast , 6@lunch , 7@supper . Reduce 1 for sugar less than 150 & increase by 1 per 50 when sugar is over 200. Discard after 28 days 07/03/16   [provider]  Insulin Syringe-Needle U-100 (INSULIN SYRINGE 1CC/30GX5/16") 30G X 5/16" 1 ML MISC Use 1 each 5 (five) times daily. E10.9 11/05/16   [provider]  lisinopril-hydrochlorothiazide (PRINZIDE,ZESTORETIC) 20-12.5 MG tablet Take 1 tablet by mouth daily. 03/27/16   [provider]  lisinopril-hydrochlorothiazide Reita May) 20-12.5 MG tablet Take by mouth. 06/20/16 06/20/17  [provider]  mirabegron ER (MYRBETRIQ) 50 MG TB24 tablet Take 50 mg by mouth daily.     [provider]  sodium fluoride (DENTA 5000 PLUS) 1.1 % CREA dental cream Place 1 application onto teeth at bedtime.    [provider]  sodium fluoride (DENTA 5000 PLUS) 1.1  % CREA dental cream  08/19/14   [provider]  tamsulosin (FLOMAX) 0.4 MG CAPS capsule Take 0.8 mg by mouth daily after supper.     [provider]  tamsulosin (FLOMAX) 0.4 MG CAPS capsule Take 0.8 mg by mouth. 06/15/15   [provider]    Allergies Patient has no known allergies.    Social History Social History   Tobacco Use  . Smoking status: Never Smoker  . Smokeless tobacco: Never Used  Substance Use Topics  . Alcohol use: No  . Drug use: Not on file    Review of Systems Patient denies headaches, rhinorrhea, blurry vision, numbness, shortness of breath, chest pain, edema, cough, abdominal pain, nausea, vomiting, diarrhea, dysuria, fevers, rashes or hallucinations unless otherwise stated above in HPI. ____________________________________________   PHYSICAL EXAM:  VITAL SIGNS: Vitals:   08/12/17 1541 08/12/17 1547  BP: (!) 185/78   Pulse: 76   Resp: (!) 21   Temp:  100.2 F (37.9 C)  SpO2: 92%  Constitutional: GCS 10 (4,1,5) ill-appearing Eyes: Conjunctivae are normal. Leftward gaze deviation Head: no obvious contusion, Nose: No congestion/rhinnorhea. Mouth/Throat: Mucous membranes are moist.   Neck: No stridor. Painless ROM.  Cardiovascular: Normal rate, regular rhythm. Grossly normal heart sounds.  Good peripheral circulation. Respiratory: Normal respiratory effort.  No retractions. Lungs CTAB. Gastrointestinal: Soft and nontender. No distention. No abdominal bruits. No CVA tenderness. Genitourinary:  Musculoskeletal: No lower extremity tenderness nor edema.  No joint effusions. Neurologic:  Normal speech and language. No gross focal neurologic deficits are appreciated. No facial droop Skin:  Skin is warm, dry and intact. No rash noted. Psychiatric: Mood and affect are normal. Speech and behavior are normal.  ____________________________________________   LABS (all labs ordered are listed, but only abnormal results are  displayed)  Results for orders placed or performed during the hospital encounter of 08/12/17 (from the past 24 hour(s))  Glucose, capillary     Status: Abnormal   Collection Time: 08/12/17 12:21 PM  Result Value Ref Range   Glucose-Capillary 504 (HH) 65 - 99 mg/dL  Basic metabolic panel     Status: Abnormal   Collection Time: 08/12/17 12:28 PM  Result Value Ref Range   Sodium 135 135 - 145 mmol/L   Potassium 4.7 3.5 - 5.1 mmol/L   Chloride 101 101 - 111 mmol/L   CO2 23 22 - 32 mmol/L   Glucose, Bld 576 (HH) 65 - 99 mg/dL   BUN 51 (H) 6 - 20 mg/dL   Creatinine, Ser 1.28 (H) 0.61 - 1.24 mg/dL   Calcium 8.8 (L) 8.9 - 10.3 mg/dL   GFR calc non Af Amer 47 (L) >60 mL/min   GFR calc Af Amer 55 (L) >60 mL/min   Anion gap 11 5 - 15  CBC     Status: Abnormal   Collection Time: 08/12/17 12:28 PM  Result Value Ref Range   WBC 6.0 3.8 - 10.6 K/uL   RBC 3.73 (L) 4.40 - 5.90 MIL/uL   Hemoglobin 12.3 (L) 13.0 - 18.0 g/dL   HCT 37.5 (L) 40.0 - 52.0 %   MCV 100.5 (H) 80.0 - 100.0 fL   MCH 32.8 26.0 - 34.0 pg   MCHC 32.7 32.0 - 36.0 g/dL   RDW 13.7 11.5 - 14.5 %   Platelets 199 150 - 440 K/uL  Glucose, capillary     Status: Abnormal   Collection Time: 08/12/17  1:18 PM  Result Value Ref Range   Glucose-Capillary 508 (HH) 65 - 99 mg/dL  Protime-INR     Status: None   Collection Time: 08/12/17  3:21 PM  Result Value Ref Range   Prothrombin Time 13.4 11.4 - 15.2 seconds   INR 1.03    ____________________________________________ ___________________________________________  RADIOLOGY  I personally reviewed all radiographic images ordered to evaluate for the above acute complaints and reviewed radiology reports and findings.  These findings were personally discussed with the patient.  Please see medical record for radiology report.  ____________________________________________   PROCEDURES  Procedure(s) performed:  .Critical Care Performed by: Merlyn Lot, MD Authorized by:  Merlyn Lot, MD   Critical care provider statement:    Critical care time (minutes):  35   Critical care time was exclusive of:  Separately billable procedures and treating other patients   Critical care was necessary to treat or prevent imminent or life-threatening deterioration of the following conditions:  Trauma   Critical care was time spent personally by me on the following activities:  Development of treatment  plan with patient or surrogate, discussions with consultants, evaluation of patient's response to treatment, examination of patient, obtaining history from patient or surrogate, ordering and performing treatments and interventions, ordering and review of laboratory studies, ordering and review of radiographic studies, pulse oximetry, re-evaluation of patient's condition and review of old charts      Critical Care performed: yes ____________________________________________   INITIAL IMPRESSION / ASSESSMENT AND PLAN / ED COURSE  Pertinent labs & imaging results that were available during my care of the patient were reviewed by me and considered in my medical decision making (see chart for details).  DDX: Dural, subarachnoid, CVA, DKA, HHS, dehydration  Chase Flores is a 84 y.o. who presents to the ED with symptoms as described above.  Patient critically ill.  CT head does show evidence of subdural hematoma with mass-effect.  Exam is concerning and consistent with these findings.  Will load for Keppra as nursing did report seizure-like activity while while laying flat in CT scanner.  Discussed need for transfer for neurosurgical evaluation and family requesting transfer to [facility].  Clinical Course as of Aug 12 1600  Tue Aug 12, 2017  1505 She has been accepted to [facility].  Discusssed critical condition with patient at bedside with family.  Patient and family states they are DNR but would agree to surgical intervention at the recommendation of neurosurgery which I  do believe is an appropriate plan.  Patient will receive IV Keppra as well as dose of IV labetalol for his hypertension and will continue IV fluids for his hyperglycemia  [PR]  1552 Patient reassessed remains hemodynamically stable prior to transfer.  Patient still protecting his airway at this time.    [PR]    Clinical Course User Index [PR] Merlyn Lot, MD     As part of my medical decision making, I reviewed the following data within the [place] notes reviewed and incorporated, Labs reviewed, notes from prior ED visits.   ____________________________________________   FINAL CLINICAL IMPRESSION(S) / ED DIAGNOSES  Final diagnoses:  Subdural hematoma (HCC)  Hyperglycemia      NEW MEDICATIONS STARTED DURING THIS VISIT:  New Prescriptions   No medications on file     Note:  This document was prepared using Dragon voice recognition software and may include unintentional dictation errors.    Merlyn Lot, MD 08/12/17 [place]    Merlyn Lot, MD 08/12/17 [phone]

## 2017-08-12 NOTE — ED Notes (Signed)
Pt now with acute right sided facial droop. Pt unable to move right leg. Pt able to follow commands but will not speak when asked to. Pt son states that when he arrived 45 minutes ago he noticed that pt had right sided facial droop. First Nurse Judeth CornfieldStephanie currently with patient. Charge nurse Tina Notified and CT scan ordered.

## 2017-08-25 DEATH — deceased

## 2017-09-29 ENCOUNTER — Ambulatory Visit: Payer: Medicare Other | Admitting: Podiatry

## 2019-09-06 IMAGING — CT CT CERVICAL SPINE W/O CM
3 of 4 series · 12 of 33 positions shown, 14 images · non-contrast
Comparison: CT neck 11/19/2015

CLINICAL DATA: Right facial droop and right leg weakness.

EXAM:
CT CERVICAL SPINE WITHOUT CONTRAST
TECHNIQUE: Multidetector CT imaging of the cervical spine was performed without
intravenous contrast. Multiplanar CT image reconstructions were also
generated.

[Series 4: sagittal bone · sagittal · 0.29mm/px · 5 of 53 slices shown, 6 images]
[im 18/53  bone]
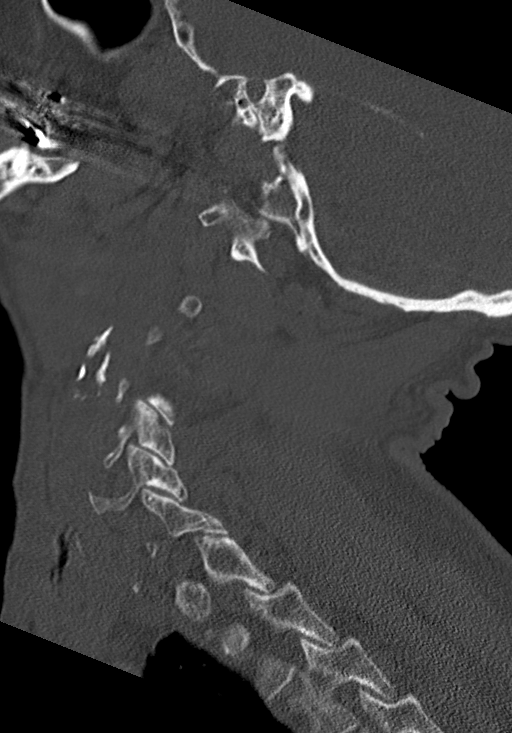
[im 22/53  bone]
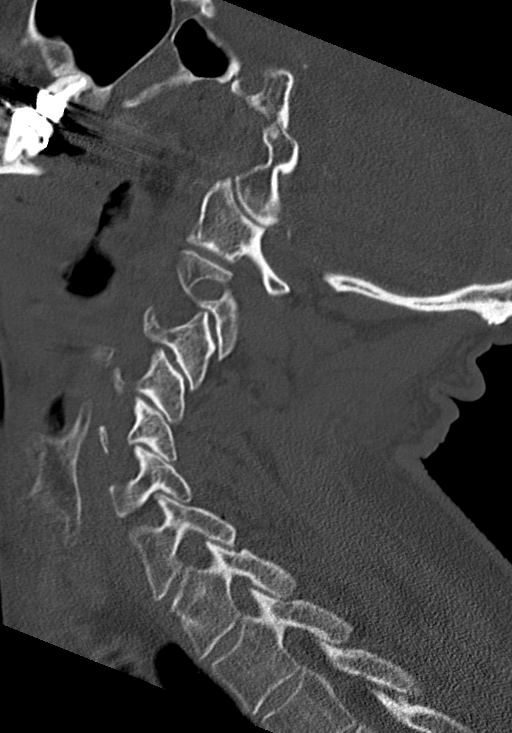
[im 27/53  soft-tissue]
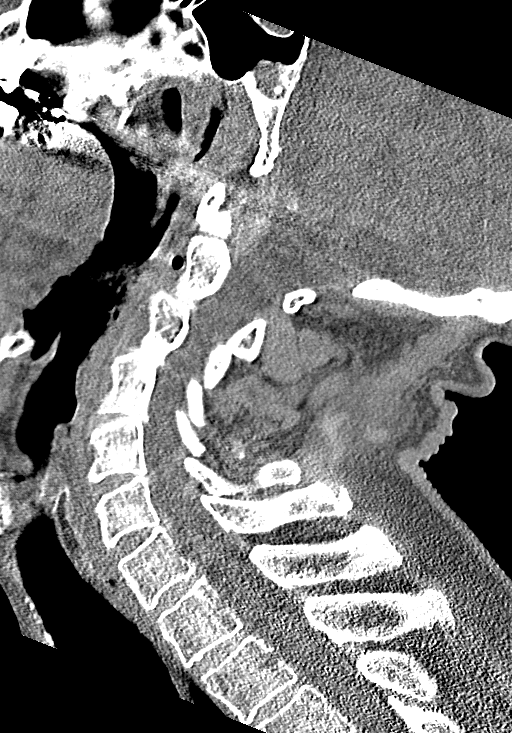
[im 27/53  bone]
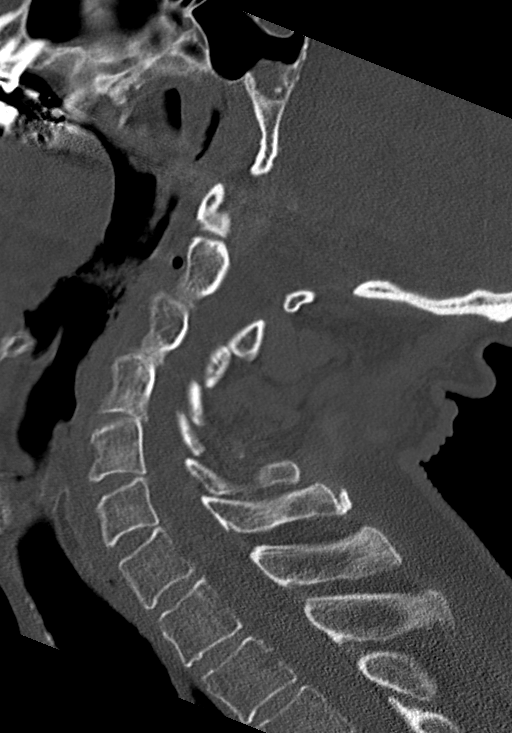
[im 31/53  bone]
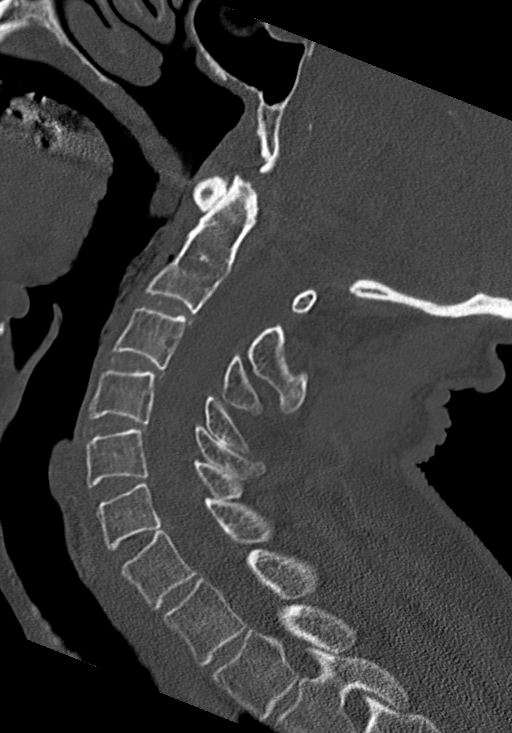
[im 35/53  bone]
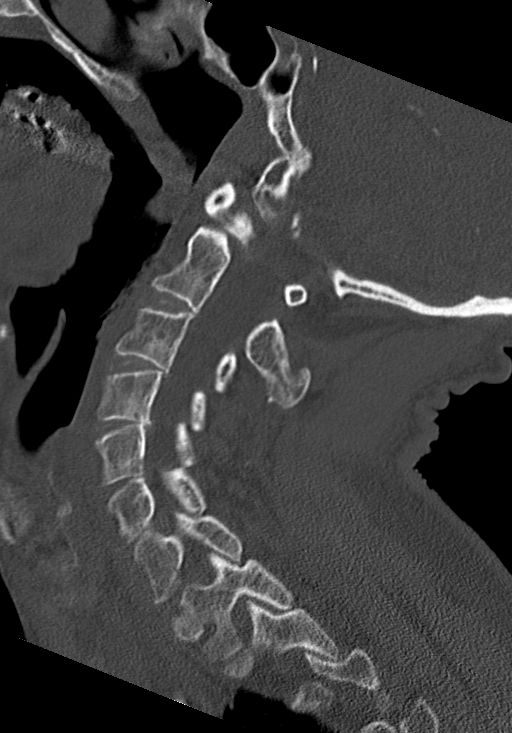

[Series 5: coronal bone · coronal · 0.28mm/px · 3 of 74 slices shown]
[im 19/74  bone]
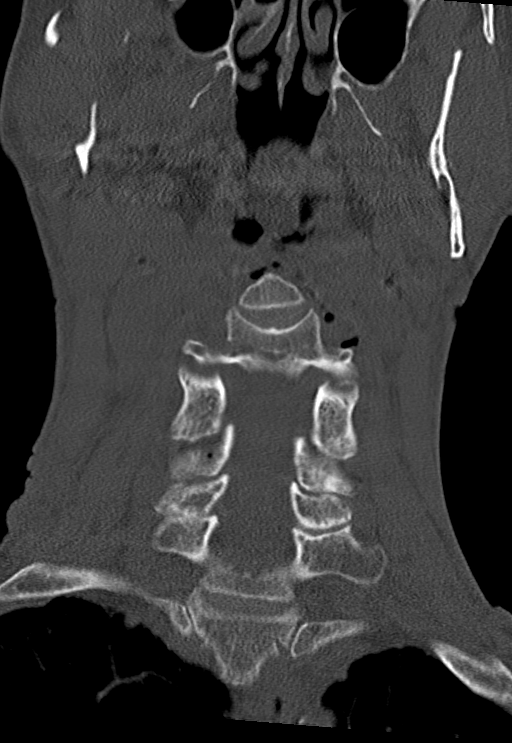
[im 31/74  bone]
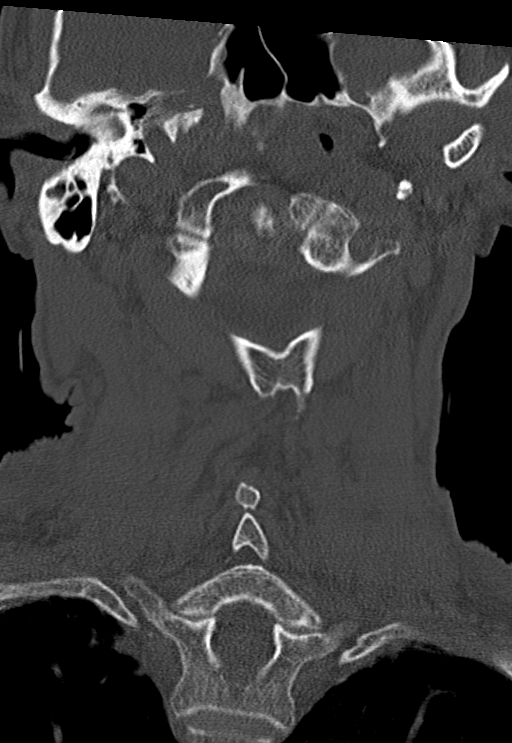
[im 43/74  bone]
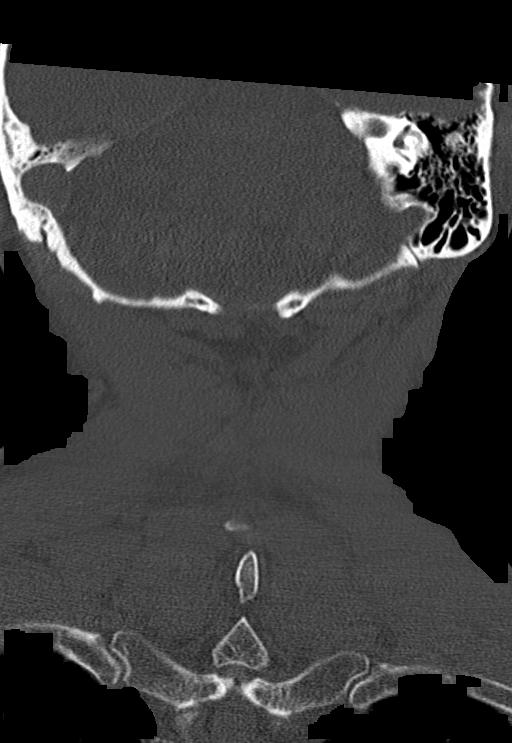

[Series 6: orthogonal bone · axial · 0.27mm/px · z∈[-329,-210]mm · 4 of 95 slices shown, 5 images]
[im 16/95  soft-tissue]
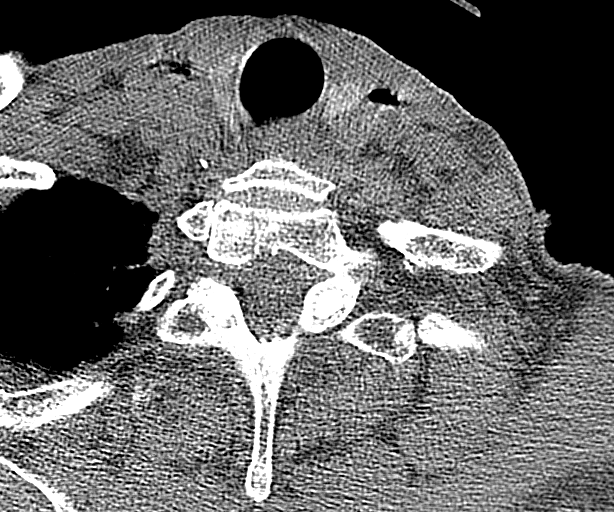
[im 16/95  bone]
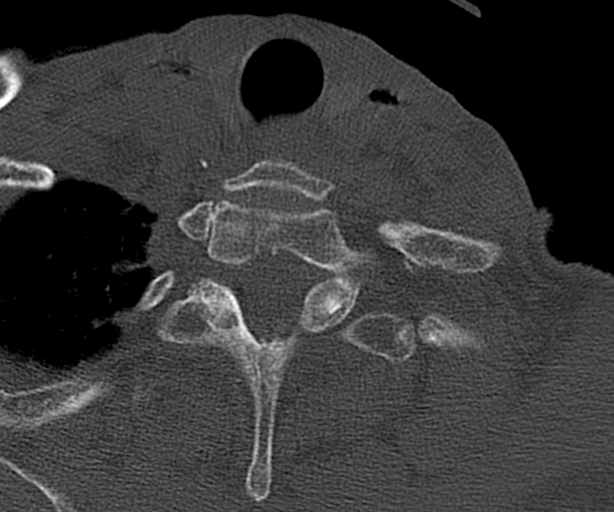
[im 32/95  bone]
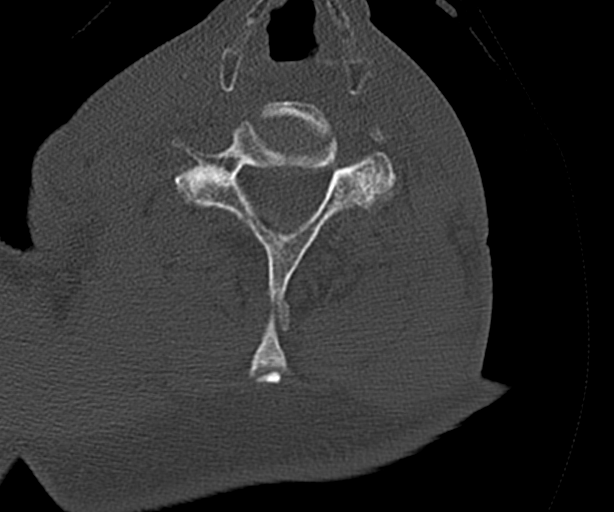
[im 63/95  bone]
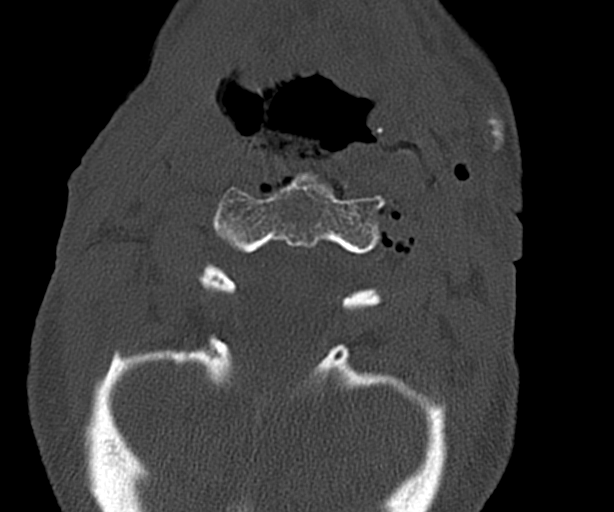
[im 79/95  bone]
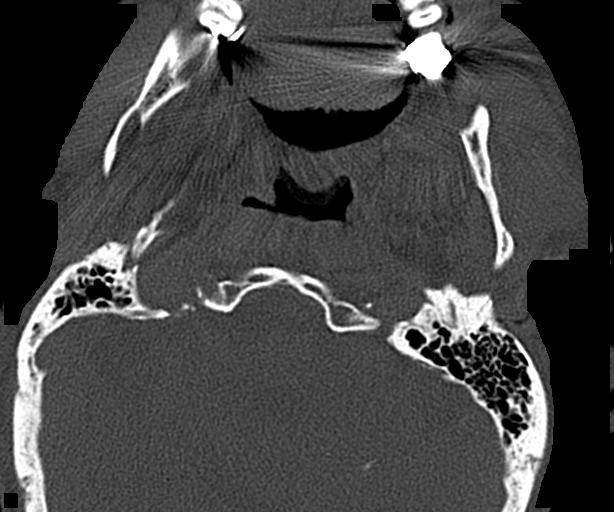

[12 of 33 positions shown; findings below may reference images not displayed]

FINDINGS: Alignment: No static subluxation. Facets are aligned. Occipital
condyles and the lateral masses of C1 and C2 are normally
approximated.

Skull base and vertebrae: No acute fracture.

Soft tissues and spinal canal: No prevertebral fluid or swelling. No
visible canal hematoma. Multiple foci of gas within the
perivertebral soft tissues is probably related to intravenous
infusion.

Disc levels: No advanced spinal canal or neural foraminal stenosis.

Upper chest: No pneumothorax, pulmonary nodule or pleural effusion.

Other: Normal visualized paraspinal cervical soft tissues.
IMPRESSION: No acute fracture or static subluxation of the cervical spine.

## 2019-09-06 IMAGING — CT CT HEAD CODE STROKE
3 of 4 series · 15 of 47 positions shown, 18 images · non-contrast
Comparison: CTA neck 11/19/2015.  MRI brain 11/18/2015.

CLINICAL DATA: Code stroke. Acute onset of right-sided facial
drooping right leg weakness. Hyperglycemia.

EXAM:
CT HEAD WITHOUT CONTRAST
TECHNIQUE: Contiguous axial images were obtained from the base of the skull
through the vertex without intravenous contrast.

[Series 2: head wo · axial · 0.43mm/px · z∈[-156,-26]mm · 9 of 32 slices shown, 12 images]
[im 3/32  brain]
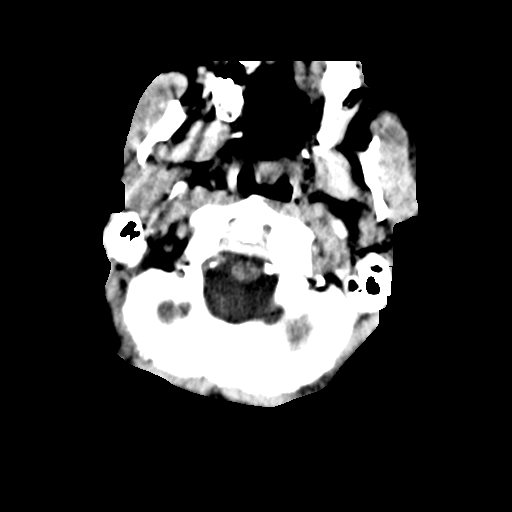
[im 3/32  bone]
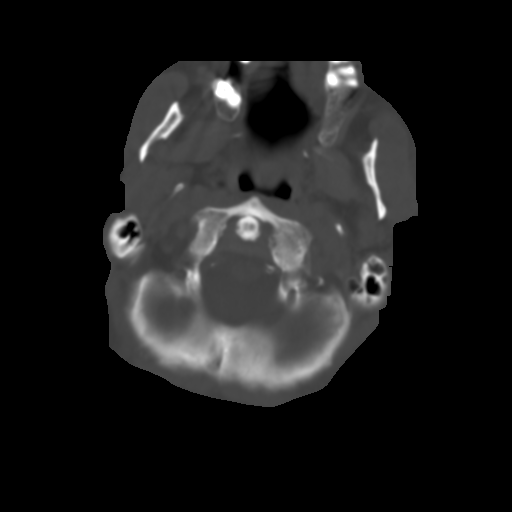
[im 7/32  brain]
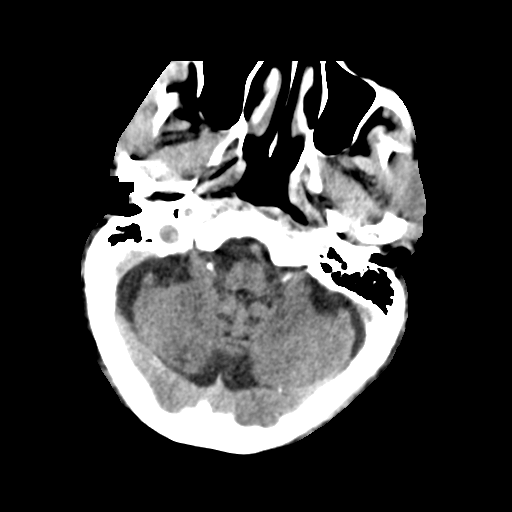
[im 9/32  brain]
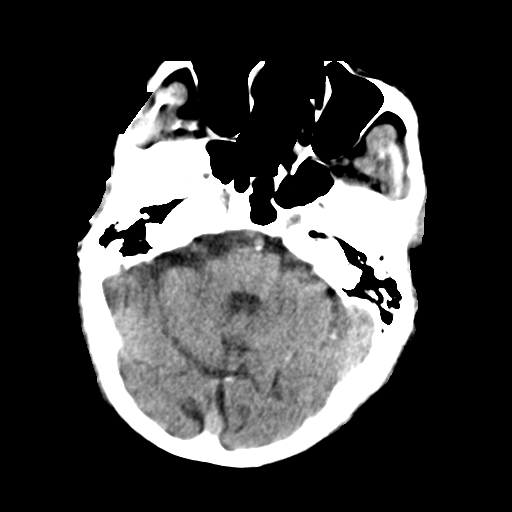
[im 14/32  brain]
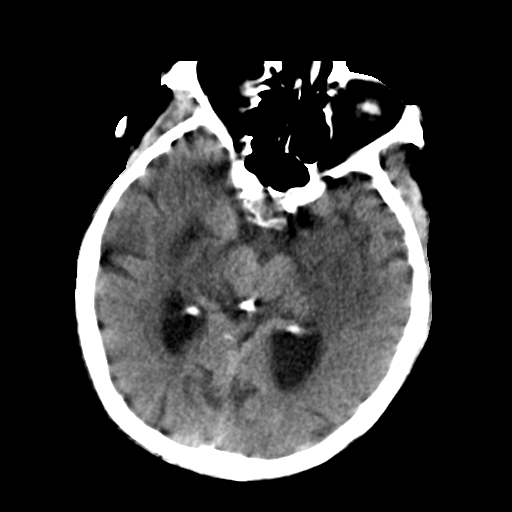
[im 16/32  brain]
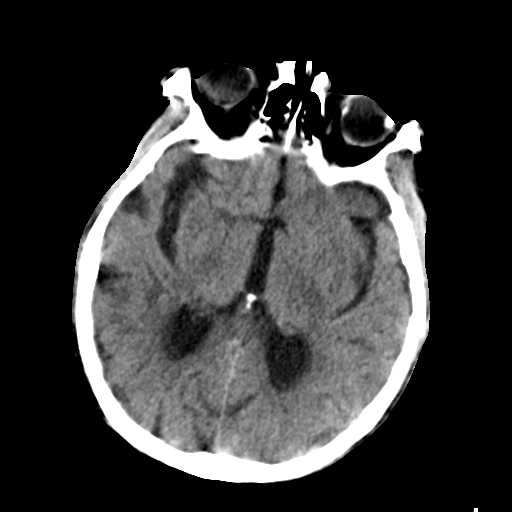
[im 16/32  bone]
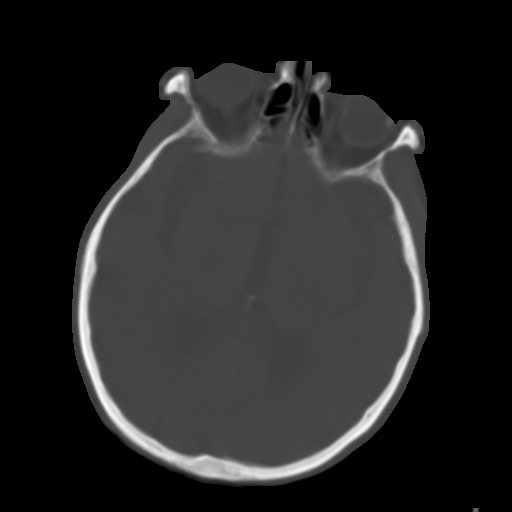
[im 18/32  brain]
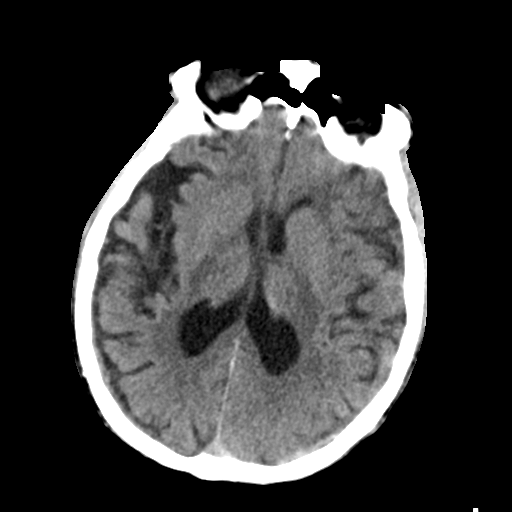
[im 23/32  brain]
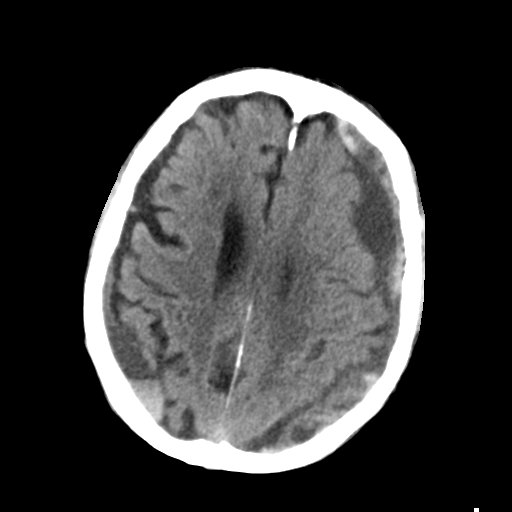
[im 25/32  brain]
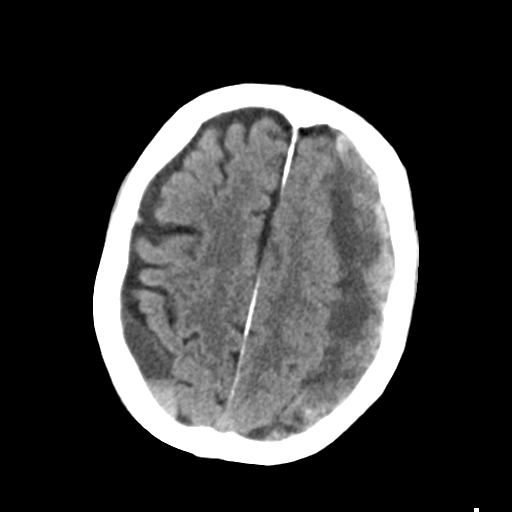
[im 29/32  brain]
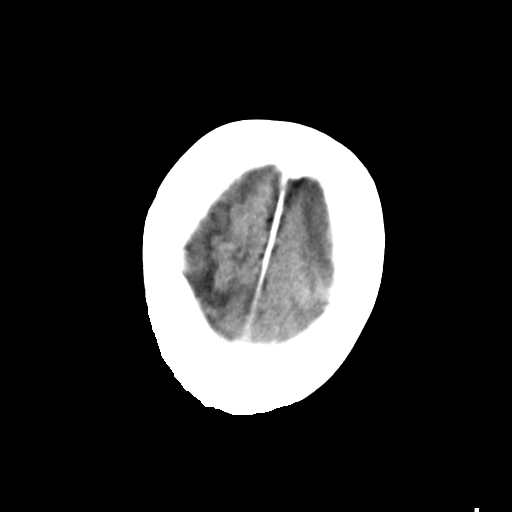
[im 29/32  bone]
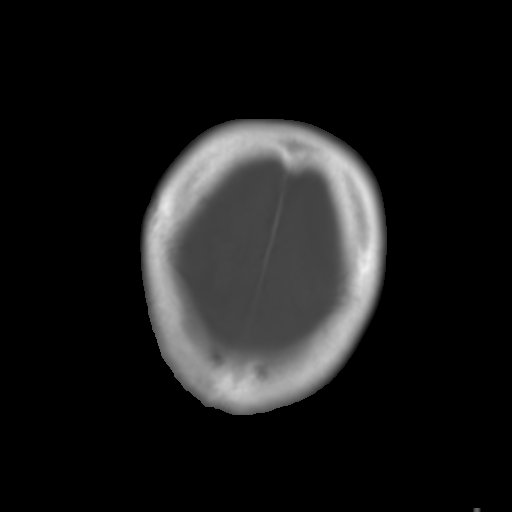

[Series 6: coronal soft tissue · coronal · 0.32mm/px · 3 of 66 slices shown]
[im 22/66  brain]
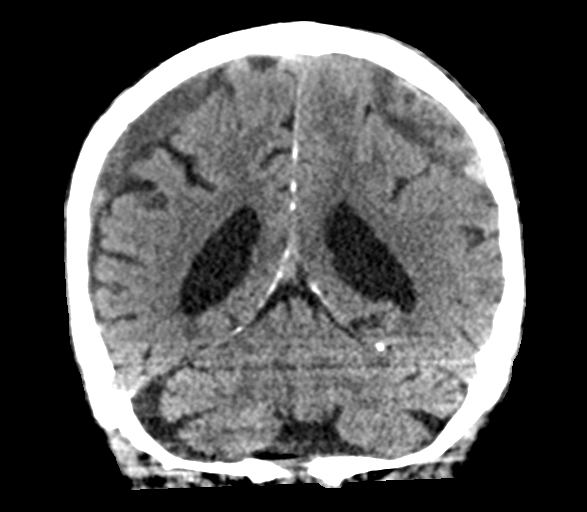
[im 29/66  brain]
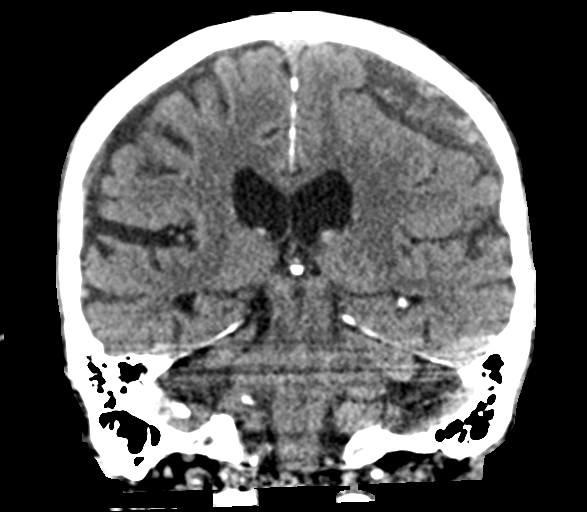
[im 37/66  brain]
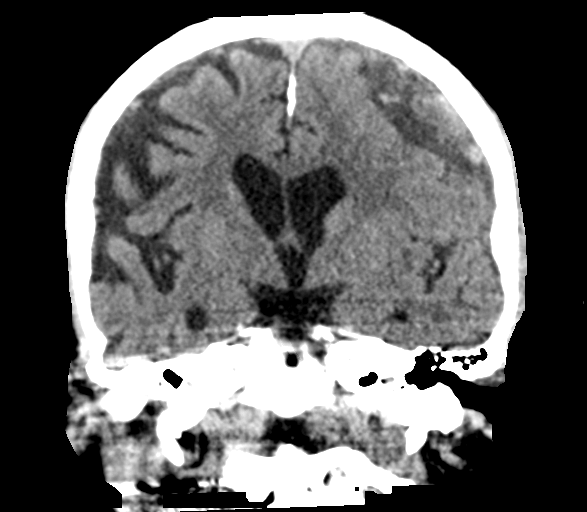

[Series 7: sagittal soft tissue · sagittal · 0.32mm/px · 3 of 55 slices shown]
[im 19/55  brain]
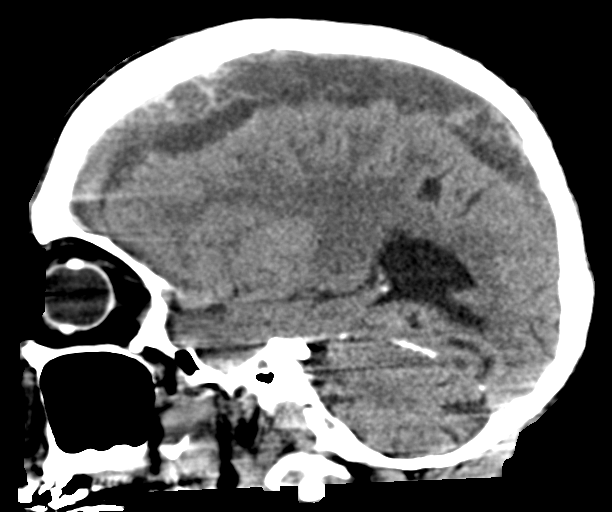
[im 28/55  brain]
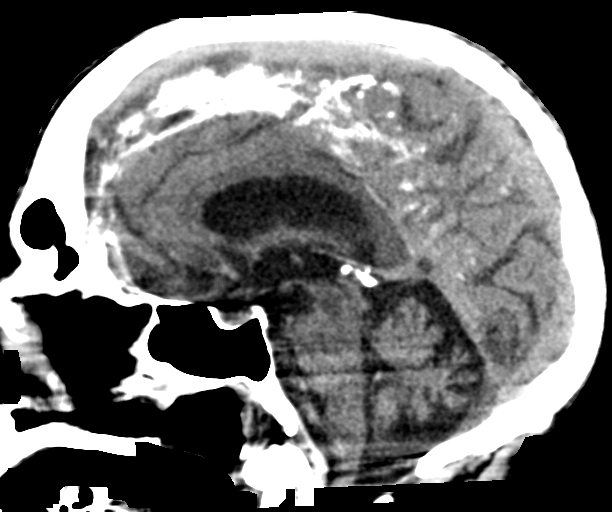
[im 37/55  brain]
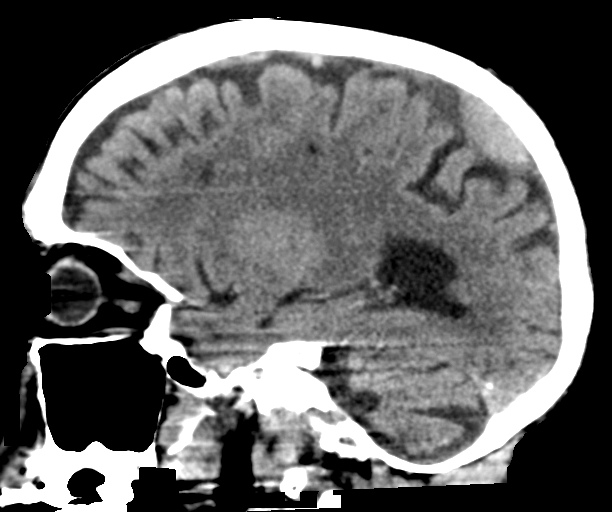

[15 of 47 positions shown; findings below may reference images not displayed]

FINDINGS: Brain: Bilateral subdural hematomas are present. The left-sided
hemorrhage is more prominent than the right. Maximal coronal
measurement on the left is 19.5 mm. The largest right-sided
collection is over the right parietal lobe measuring 14.9 mm. Two or
3 mm of left-to-right midline shift is present with some mass effect
on the left lateral ventricle. There is significant mass effect on
the anterior left frontal lobe which likely explains the patient's
right lower extremity weakness. No significant brainstem mass effect
is evident. Facial weakness may be cortical.

No parenchymal hemorrhage is present. No focal infarct is evident.
Basal ganglia are stable. Moderate generalized atrophy is otherwise
stable. Brainstem and cerebellum are unremarkable.

Vascular: Vascular calcifications are present within the cavernous
internal carotid arteries bilaterally and at the dural margin of
both vertebral arteries. No hyperdense vessel is present.

Skull: Calvarium is intact. No focal lytic or blastic lesions are
evident.

Sinuses/Orbits: The paranasal sinuses and mastoid air cells are
clear. Globes and orbits are within normal limits.
IMPRESSION: 1. Bilateral subdural hemorrhages, left greater than right.
2. Left-sided mass effect involving the superior left frontal and
parietal lobe with 2-3 mm of midline shift.
3. No significant mass effect on the brainstem or cerebellum.

These results were called by telephone at the time of interpretation
on 08/12/2017 at [DATE] to Dr. CAMBRIA BOLDT , who verbally
acknowledged these results.
